# Patient Record
Sex: Female | Born: 1990 | Race: White | Hispanic: No | Marital: Single | Smoking: Current every day smoker
Health system: Southern US, Community
[De-identification: ages and names within clinical notes are randomized; demographics above are authoritative.]

## PROBLEM LIST (undated history)

## (undated) ENCOUNTER — Inpatient Hospital Stay (HOSPITAL_COMMUNITY): Payer: Self-pay

## (undated) DIAGNOSIS — R87619 Unspecified abnormal cytological findings in specimens from cervix uteri: Secondary | ICD-10-CM

## (undated) DIAGNOSIS — IMO0002 Reserved for concepts with insufficient information to code with codable children: Secondary | ICD-10-CM

## (undated) DIAGNOSIS — B009 Herpesviral infection, unspecified: Secondary | ICD-10-CM

## (undated) DIAGNOSIS — N390 Urinary tract infection, site not specified: Secondary | ICD-10-CM

## (undated) HISTORY — PX: NO PAST SURGERIES: SHX2092

---
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
## 2008-04-16 ENCOUNTER — Emergency Department (HOSPITAL_COMMUNITY): Admission: EM | Admit: 2008-04-16 | Discharge: 2008-04-16 | Payer: Self-pay | Admitting: Emergency Medicine

## 2008-05-02 ENCOUNTER — Emergency Department (HOSPITAL_COMMUNITY): Admission: EM | Admit: 2008-05-02 | Discharge: 2008-05-02 | Payer: Self-pay | Admitting: Emergency Medicine

## 2008-12-09 ENCOUNTER — Emergency Department (HOSPITAL_COMMUNITY): Admission: EM | Admit: 2008-12-09 | Discharge: 2008-12-09 | Payer: Self-pay | Admitting: Emergency Medicine

## 2009-01-13 ENCOUNTER — Emergency Department (HOSPITAL_COMMUNITY): Admission: EM | Admit: 2009-01-13 | Discharge: 2009-01-13 | Payer: Self-pay | Admitting: Emergency Medicine

## 2009-01-19 ENCOUNTER — Inpatient Hospital Stay (HOSPITAL_COMMUNITY): Admission: AD | Admit: 2009-01-19 | Discharge: 2009-01-19 | Payer: Self-pay | Admitting: Family Medicine

## 2009-01-29 ENCOUNTER — Ambulatory Visit (HOSPITAL_COMMUNITY): Admission: RE | Admit: 2009-01-29 | Discharge: 2009-01-29 | Payer: Self-pay | Admitting: Family Medicine

## 2009-04-09 ENCOUNTER — Inpatient Hospital Stay (HOSPITAL_COMMUNITY): Admission: AD | Admit: 2009-04-09 | Discharge: 2009-04-09 | Payer: Self-pay | Admitting: Obstetrics

## 2009-05-05 ENCOUNTER — Ambulatory Visit
Admission: RE | Admit: 2009-05-05 | Discharge: 2009-05-05 | Payer: Self-pay | Source: Home / Self Care | Admitting: Obstetrics

## 2009-05-28 ENCOUNTER — Inpatient Hospital Stay (HOSPITAL_COMMUNITY): Admission: AD | Admit: 2009-05-28 | Discharge: 2009-05-28 | Payer: Self-pay | Admitting: Obstetrics

## 2009-05-28 ENCOUNTER — Ambulatory Visit: Payer: Self-pay | Admitting: Advanced Practice Midwife

## 2009-08-18 ENCOUNTER — Inpatient Hospital Stay (HOSPITAL_COMMUNITY): Admission: AD | Admit: 2009-08-18 | Discharge: 2009-08-18 | Payer: Self-pay | Admitting: Obstetrics

## 2009-08-18 ENCOUNTER — Ambulatory Visit: Payer: Self-pay | Admitting: Advanced Practice Midwife

## 2009-09-03 ENCOUNTER — Ambulatory Visit: Payer: Self-pay | Admitting: Obstetrics and Gynecology

## 2009-09-03 ENCOUNTER — Inpatient Hospital Stay (HOSPITAL_COMMUNITY): Admission: AD | Admit: 2009-09-03 | Discharge: 2009-09-03 | Payer: Self-pay | Admitting: Obstetrics

## 2009-09-10 ENCOUNTER — Inpatient Hospital Stay (HOSPITAL_COMMUNITY): Admission: AD | Admit: 2009-09-10 | Discharge: 2009-09-10 | Payer: Self-pay | Admitting: Obstetrics

## 2009-09-20 ENCOUNTER — Inpatient Hospital Stay (HOSPITAL_COMMUNITY): Admission: AD | Admit: 2009-09-20 | Discharge: 2009-09-20 | Payer: Self-pay | Admitting: Obstetrics

## 2009-09-23 ENCOUNTER — Inpatient Hospital Stay (HOSPITAL_COMMUNITY): Admission: RE | Admit: 2009-09-23 | Discharge: 2009-09-25 | Payer: Self-pay | Admitting: Obstetrics

## 2010-01-06 ENCOUNTER — Emergency Department (HOSPITAL_COMMUNITY)
Admission: EM | Admit: 2010-01-06 | Discharge: 2010-01-06 | Payer: Self-pay | Source: Home / Self Care | Admitting: Emergency Medicine

## 2010-02-18 ENCOUNTER — Emergency Department (HOSPITAL_COMMUNITY)
Admission: EM | Admit: 2010-02-18 | Discharge: 2010-02-19 | Disposition: A | Discharge status: Home / Self Care | Payer: Medicaid Other | Attending: Emergency Medicine | Admitting: Emergency Medicine

## 2010-02-18 ENCOUNTER — Emergency Department (HOSPITAL_COMMUNITY)
Admission: EM | Admit: 2010-02-18 | Discharge: 2010-02-18 | Discharge status: Against Medical Advice | Payer: Self-pay | Attending: Emergency Medicine | Admitting: Emergency Medicine

## 2010-02-18 DIAGNOSIS — R21 Rash and other nonspecific skin eruption: Secondary | ICD-10-CM | POA: Insufficient documentation

## 2010-02-18 DIAGNOSIS — I10 Essential (primary) hypertension: Secondary | ICD-10-CM | POA: Insufficient documentation

## 2010-02-18 DIAGNOSIS — T7840XA Allergy, unspecified, initial encounter: Secondary | ICD-10-CM | POA: Insufficient documentation

## 2010-02-18 DIAGNOSIS — L298 Other pruritus: Secondary | ICD-10-CM | POA: Insufficient documentation

## 2010-02-18 DIAGNOSIS — R221 Localized swelling, mass and lump, neck: Secondary | ICD-10-CM | POA: Insufficient documentation

## 2010-02-18 DIAGNOSIS — R220 Localized swelling, mass and lump, head: Secondary | ICD-10-CM | POA: Insufficient documentation

## 2010-02-18 DIAGNOSIS — L2989 Other pruritus: Secondary | ICD-10-CM | POA: Insufficient documentation

## 2010-03-18 LAB — CBC
HCT: 27.4 % — ABNORMAL LOW (ref 36.0–46.0)
HCT: 32 % — ABNORMAL LOW (ref 36.0–46.0)
Hemoglobin: 10.6 g/dL — ABNORMAL LOW (ref 12.0–15.0)
MCH: 28.6 pg (ref 26.0–34.0)
MCV: 85.1 fL (ref 78.0–100.0)
MCV: 85.5 fL (ref 78.0–100.0)
Platelets: 196 10*3/uL (ref 150–400)
Platelets: 248 10*3/uL (ref 150–400)
RBC: 3.76 MIL/uL — ABNORMAL LOW (ref 3.87–5.11)
WBC: 10.9 10*3/uL — ABNORMAL HIGH (ref 4.0–10.5)

## 2010-03-18 LAB — WET PREP, GENITAL

## 2010-03-18 LAB — RPR: RPR Ser Ql: NONREACTIVE

## 2010-03-19 LAB — URINALYSIS, ROUTINE W REFLEX MICROSCOPIC
Hgb urine dipstick: NEGATIVE
Ketones, ur: NEGATIVE mg/dL

## 2010-03-21 LAB — URINALYSIS, ROUTINE W REFLEX MICROSCOPIC
Bilirubin Urine: NEGATIVE
Ketones, ur: 15 mg/dL — AB
Leukocytes, UA: NEGATIVE
Protein, ur: NEGATIVE mg/dL
Urobilinogen, UA: 1 mg/dL (ref 0.0–1.0)

## 2010-03-21 LAB — WET PREP, GENITAL: Trich, Wet Prep: NONE SEEN

## 2010-03-21 LAB — POCT PREGNANCY, URINE: Preg Test, Ur: POSITIVE

## 2010-03-21 LAB — URINE MICROSCOPIC-ADD ON

## 2010-03-21 LAB — GC/CHLAMYDIA PROBE AMP, GENITAL: Chlamydia, DNA Probe: NEGATIVE

## 2010-03-22 LAB — URINALYSIS, ROUTINE W REFLEX MICROSCOPIC
Glucose, UA: NEGATIVE mg/dL
Hgb urine dipstick: NEGATIVE
Ketones, ur: NEGATIVE mg/dL
Nitrite: NEGATIVE
Protein, ur: NEGATIVE mg/dL
Specific Gravity, Urine: 1.01 (ref 1.005–1.030)
Urobilinogen, UA: 0.2 mg/dL (ref 0.0–1.0)
pH: 7.5 (ref 5.0–8.0)

## 2010-03-22 LAB — URINE MICROSCOPIC-ADD ON

## 2010-03-24 LAB — URINALYSIS, ROUTINE W REFLEX MICROSCOPIC
Bilirubin Urine: NEGATIVE
Hgb urine dipstick: NEGATIVE
Nitrite: NEGATIVE
Protein, ur: NEGATIVE mg/dL
Urobilinogen, UA: 0.2 mg/dL (ref 0.0–1.0)
pH: 7 (ref 5.0–8.0)

## 2010-03-24 LAB — POCT PREGNANCY, URINE: Preg Test, Ur: POSITIVE

## 2010-03-24 LAB — WET PREP, GENITAL: Trich, Wet Prep: NONE SEEN

## 2010-03-24 LAB — GC/CHLAMYDIA PROBE AMP, GENITAL: GC Probe Amp, Genital: NEGATIVE

## 2010-04-14 LAB — DIFFERENTIAL
Basophils Absolute: 0.1 10*3/uL (ref 0.0–0.1)
Basophils Relative: 1 % (ref 0–1)
Eosinophils Relative: 1 % (ref 0–5)
Lymphocytes Relative: 15 % (ref 12–46)
Monocytes Absolute: 0.8 10*3/uL (ref 0.1–1.0)

## 2010-04-14 LAB — WET PREP, GENITAL
Clue Cells Wet Prep HPF POC: NONE SEEN
Trich, Wet Prep: NONE SEEN
Yeast Wet Prep HPF POC: NONE SEEN

## 2010-04-14 LAB — CBC
HCT: 42.4 % (ref 36.0–46.0)
Hemoglobin: 14.7 g/dL (ref 12.0–15.0)
MCHC: 34.6 g/dL (ref 30.0–36.0)
Platelets: 215 10*3/uL (ref 150–400)
RDW: 12.3 % (ref 11.5–15.5)

## 2010-04-14 LAB — URINALYSIS, ROUTINE W REFLEX MICROSCOPIC
Bilirubin Urine: NEGATIVE
Glucose, UA: NEGATIVE mg/dL
Hgb urine dipstick: NEGATIVE
Specific Gravity, Urine: 1.007 (ref 1.005–1.030)
pH: 7 (ref 5.0–8.0)

## 2010-04-14 LAB — POCT INFECTIOUS MONO SCREEN: Mono Screen: NEGATIVE

## 2010-04-14 LAB — STREP A DNA PROBE

## 2010-04-14 LAB — POCT RAPID STREP A (OFFICE): Streptococcus, Group A Screen (Direct): NEGATIVE

## 2010-04-14 LAB — GC/CHLAMYDIA PROBE AMP, GENITAL
Chlamydia, DNA Probe: NEGATIVE
GC Probe Amp, Genital: NEGATIVE

## 2010-04-16 ENCOUNTER — Emergency Department (HOSPITAL_COMMUNITY)
Admission: EM | Admit: 2010-04-16 | Discharge: 2010-04-16 | Discharge status: Against Medical Advice | Payer: Medicaid Other | Attending: Emergency Medicine | Admitting: Emergency Medicine

## 2010-04-16 DIAGNOSIS — N63 Unspecified lump in unspecified breast: Secondary | ICD-10-CM | POA: Insufficient documentation

## 2010-04-25 ENCOUNTER — Inpatient Hospital Stay (HOSPITAL_COMMUNITY)
Admission: AD | Admit: 2010-04-25 | Discharge: 2010-04-25 | Disposition: A | Discharge status: Home / Self Care | Payer: Medicaid Other | Source: Ambulatory Visit | Attending: Obstetrics & Gynecology | Admitting: Obstetrics & Gynecology

## 2010-04-25 DIAGNOSIS — N644 Mastodynia: Secondary | ICD-10-CM

## 2010-04-26 ENCOUNTER — Other Ambulatory Visit: Payer: Self-pay | Admitting: Obstetrics & Gynecology

## 2010-04-26 DIAGNOSIS — N63 Unspecified lump in unspecified breast: Secondary | ICD-10-CM

## 2010-04-27 ENCOUNTER — Inpatient Hospital Stay: Admission: RE | Admit: 2010-04-27 | Payer: Medicaid Other | Source: Ambulatory Visit

## 2010-09-26 ENCOUNTER — Emergency Department (HOSPITAL_COMMUNITY)
Admission: EM | Admit: 2010-09-26 | Discharge: 2010-09-26 | Disposition: A | Discharge status: Home / Self Care | Payer: Medicaid Other | Attending: Emergency Medicine | Admitting: Emergency Medicine

## 2010-09-26 DIAGNOSIS — L03317 Cellulitis of buttock: Secondary | ICD-10-CM | POA: Insufficient documentation

## 2010-09-26 DIAGNOSIS — L0231 Cutaneous abscess of buttock: Secondary | ICD-10-CM | POA: Insufficient documentation

## 2010-09-28 ENCOUNTER — Inpatient Hospital Stay (INDEPENDENT_AMBULATORY_CARE_PROVIDER_SITE_OTHER)
Admission: RE | Admit: 2010-09-28 | Discharge: 2010-09-28 | Disposition: A | Discharge status: Home / Self Care | Payer: Medicaid Other | Source: Ambulatory Visit | Attending: Family Medicine | Admitting: Family Medicine

## 2010-09-28 DIAGNOSIS — L0231 Cutaneous abscess of buttock: Secondary | ICD-10-CM

## 2011-01-04 NOTE — L&D Delivery Note (Signed)
Delivery Note At 9:26 PM a viable female was delivered via Vaginal, Spontaneous Delivery (Presentation: ; Occiput Anterior).  APGAR: , ; weight .   Placenta status: , .  Cord: 3 vessels with the following complications: None.  Cord pH: not done  Anesthesia: Epidural  Episiotomy: None Lacerations: None Suture Repair: 2.0 Est. Blood Loss (mL):   Mom to postpartum.  Baby to nursery-stable.  Meagan Burgess A 11/15/2011, 9:39 PM

## 2011-03-29 ENCOUNTER — Inpatient Hospital Stay (HOSPITAL_COMMUNITY)
Admission: AD | Admit: 2011-03-29 | Discharge: 2011-03-29 | Disposition: A | Discharge status: Home / Self Care | Payer: Self-pay | Source: Ambulatory Visit | Attending: Obstetrics & Gynecology | Admitting: Obstetrics & Gynecology

## 2011-03-29 ENCOUNTER — Encounter (HOSPITAL_COMMUNITY): Payer: Self-pay | Admitting: *Deleted

## 2011-03-29 ENCOUNTER — Inpatient Hospital Stay (HOSPITAL_COMMUNITY): Payer: Self-pay

## 2011-03-29 DIAGNOSIS — O99891 Other specified diseases and conditions complicating pregnancy: Secondary | ICD-10-CM | POA: Insufficient documentation

## 2011-03-29 DIAGNOSIS — R109 Unspecified abdominal pain: Secondary | ICD-10-CM | POA: Insufficient documentation

## 2011-03-29 DIAGNOSIS — O26899 Other specified pregnancy related conditions, unspecified trimester: Secondary | ICD-10-CM

## 2011-03-29 LAB — URINALYSIS, ROUTINE W REFLEX MICROSCOPIC
Bilirubin Urine: NEGATIVE
Glucose, UA: NEGATIVE mg/dL
Hgb urine dipstick: NEGATIVE
Protein, ur: NEGATIVE mg/dL

## 2011-03-29 LAB — WET PREP, GENITAL
Trich, Wet Prep: NONE SEEN
Yeast Wet Prep HPF POC: NONE SEEN

## 2011-03-29 LAB — POCT PREGNANCY, URINE: Preg Test, Ur: POSITIVE — AB

## 2011-03-29 LAB — CBC
HCT: 38.9 % (ref 36.0–46.0)
Hemoglobin: 13.1 g/dL (ref 12.0–15.0)
MCHC: 33.7 g/dL (ref 30.0–36.0)
MCV: 89.6 fL (ref 78.0–100.0)

## 2011-03-29 NOTE — MAU Provider Note (Signed)
History     CSN: [id number]  Arrival date and time: 03/29/11 1548   First Provider Initiated Contact with Patient 03/29/11 1714      Chief Complaint  Patient presents with  . Possible Pregnancy   HPI Pt is early pregnant with LMP 02/15/2011 and lower abdominal pain that is 2/10 constant and increases with certain moves- the pain is bilateral.  She denies spotting or bleeding or vaginal discharge.  She denies pain with urination or constiaption or diarrhea.    Past Medical History  Diagnosis Date  . No pertinent past medical history     Past Surgical History  Procedure Date  . No past surgeries     Family History  Problem Relation Age of Onset  . Anesthesia problems Neg Hx     History  Substance Use Topics  . Smoking status: Current Everyday Smoker    Types: Cigarettes  . Smokeless tobacco: Not on file  . Alcohol Use:     Allergies: No Known Allergies  Prescriptions prior to admission  Medication Sig Dispense Refill  . acetaminophen (TYLENOL) 500 MG tablet Take 1,000 mg by mouth every 6 (six) hours as needed.        Review of Systems  Constitutional: Negative for fever and chills.  Gastrointestinal: Positive for nausea and abdominal pain. Negative for vomiting, diarrhea and constipation.  Genitourinary: Positive for frequency. Negative for dysuria and urgency.  Neurological: Negative for dizziness.   Physical Exam   Blood pressure 107/66, pulse 83, temperature 98.4 F (36.9 C), temperature source Oral, resp. rate 16, height 5\' 1"  (1.549 m), weight 112 lb 2 oz (50.86 kg), last menstrual period 02/15/2011, SpO2 100.00%.  Physical Exam  Vitals reviewed. Constitutional: She is oriented to person, place, and time. She appears well-developed and well-nourished.  HENT:  Head: Normocephalic.  Eyes: Pupils are equal, round, and reactive to light.  Neck: Normal range of motion. Neck supple.  Cardiovascular: Normal rate.   Respiratory: Effort normal.  GI: Soft.  She exhibits no distension. There is tenderness. There is guarding. There is no rebound.  Genitourinary:       Small amount of creamy white discharge in vault; cervix clean, NT; Uterus NSSC left adnexal tenderness noted without rebound- right adnexa slightly tender  Musculoskeletal: Normal range of motion.  Neurological: She is alert and oriented to person, place, and time.  Skin: Skin is warm and dry.  Psychiatric: She has a normal mood and affect.    MAU Course  Procedures  Results for orders placed during the hospital encounter of 03/29/11 (from the past 24 hour(s))  PREGNANCY, URINE     Status: Abnormal   Collection Time   03/29/11  4:11 PM      Component Value Range   Preg Test, Ur POSITIVE (*) NEGATIVE   URINALYSIS, ROUTINE W REFLEX MICROSCOPIC     Status: Normal   Collection Time   03/29/11  4:11 PM      Component Value Range   Color, Urine YELLOW  YELLOW    APPearance CLEAR  CLEAR    Specific Gravity, Urine 1.010  1.005 - 1.030    pH 7.0  5.0 - 8.0    Glucose, UA NEGATIVE  NEGATIVE (mg/dL)   Hgb urine dipstick NEGATIVE  NEGATIVE    Bilirubin Urine NEGATIVE  NEGATIVE    Ketones, ur NEGATIVE  NEGATIVE (mg/dL)   Protein, ur NEGATIVE  NEGATIVE (mg/dL)   Urobilinogen, UA 0.2  0.0 - 1.0 (mg/dL)  Nitrite NEGATIVE  NEGATIVE    Leukocytes, UA NEGATIVE  NEGATIVE   POCT PREGNANCY, URINE     Status: Abnormal   Collection Time   03/29/11  4:58 PM      Component Value Range   Preg Test, Ur POSITIVE (*) NEGATIVE   WET PREP, GENITAL     Status: Abnormal   Collection Time   03/29/11  5:27 PM      Component Value Range   Yeast Wet Prep HPF POC NONE SEEN  NONE SEEN    Trich, Wet Prep NONE SEEN  NONE SEEN    Clue Cells Wet Prep HPF POC NONE SEEN  NONE SEEN    WBC, Wet Prep HPF POC MODERATE (*) NONE SEEN   CBC     Status: Abnormal   Collection Time   03/29/11  5:30 PM      Component Value Range   WBC 10.8 (*) 4.0 - 10.5 (K/uL)   RBC 4.34  3.87 - 5.11 (MIL/uL)   Hemoglobin 13.1   12.0 - 15.0 (g/dL)   HCT 13.2  44.0 - 10.2 (%)   MCV 89.6  78.0 - 100.0 (fL)   MCH 30.2  26.0 - 34.0 (pg)   MCHC 33.7  30.0 - 36.0 (g/dL)   RDW 72.5  36.6 - 44.0 (%)   Platelets 208  150 - 400 (K/uL)  HCG, QUANTITATIVE, PREGNANCY     Status: Abnormal   Collection Time   03/29/11  5:30 PM      Component Value Range   hCG, Beta Chain, Quant, S 333 (*) <5 (mIU/mL)   Assessment and Plan  Abdominal pain in early pregnancy F/u repeat BetaHCG Friday am 04/01/2011 Ectopic precautions given  Meagan Burgess 03/29/2011, 5:15 PM

## 2011-03-29 NOTE — MAU Note (Signed)
Pt in c/o sharp bilateral lower abdominal pains, worse with movement and walking.  Minimal pain right now.  LMP 02/15/11.  Denies any bleeding or discharge.  + UPT 4 days ago.

## 2011-03-29 NOTE — Discharge Instructions (Signed)
Abdominal Pain During Pregnancy Abdominal discomfort is common in pregnancy. Most of the time, it does not cause harm. There are many causes of abdominal pain. Some causes are more serious than others. Some of the causes of abdominal pain in pregnancy are easily diagnosed. Occasionally, the diagnosis takes time to understand. Other times, the cause is not determined. Abdominal pain can be a sign that something is very wrong with the pregnancy, or the pain may have nothing to do with the pregnancy at all. For this reason, always tell your caregiver if you have any abdominal discomfort. CAUSES Common and harmless causes of abdominal pain include:  Constipation.   Excess gas and bloating.   Round ligament pain. This is pain that is felt in the folds of the groin.   The position the baby or placenta is in.   Baby kicks.   Braxton-Hicks contractions. These are mild contractions that do not cause cervical dilation.  Serious causes of abdominal pain include:  Ectopic pregnancy. This happens when a fertilized egg implants outside of the uterus.   Miscarriage.   Preterm labor. This is when labor starts at less than 37 weeks of pregnancy.   Placental abruption. This is when the placenta partially or completely separates from the uterus.   Preeclampsia. This is often associated with high blood pressure and has been referred to as "toxemia in pregnancy."   Uterine or amniotic fluid infections.  Causes unrelated to pregnancy include:  Urinary tract infection.   Gallbladder stones or inflammation.   Hepatitis or other liver illness.   Intestinal problems, stomach flu, food poisoning, or ulcer.   Appendicitis.   Kidney (renal) stones.   Kidney infection (pylonephritis).  HOME CARE INSTRUCTIONS  For mild pain:  Do not have sexual intercourse or put anything in your vagina until your symptoms go away completely.   Get plenty of rest until your pain improves. If your pain does not  improve in 1 hour, call your caregiver.   Drink clear fluids if you feel nauseous. Avoid solid food as long as you are uncomfortable or nauseous.   Only take medicine as directed by your caregiver.   Keep all follow-up appointments with your caregiver.  SEEK IMMEDIATE MEDICAL CARE IF:  You are bleeding, leaking fluid, or passing tissue from the vagina.   You have increasing pain or cramping.   You have persistent vomiting.   You have painful or bloody urination.   You have a fever.   You notice a decrease in your baby's movements.   You have extreme weakness or feel faint.   You have shortness of breath, with or without abdominal pain.   You develop a severe headache with abdominal pain.   You have abnormal vaginal discharge with abdominal pain.   You have persistent diarrhea.   You have abdominal pain that continues even after rest, or gets worse.  MAKE SURE YOU:   Understand these instructions.   Will watch your condition.   Will get help right away if you are not doing well or get worse.  Document Released: 12/20/2004 Document Revised: 12/09/2010 Document Reviewed: 07/16/2010 Exit[employer] Patient Information 2012 [employer].Abdominal Pain During Pregnancy Abdominal discomfort is common in pregnancy. Most of the time, it does not cause harm. There are many causes of abdominal pain. Some causes are more serious than others. Some of the causes of abdominal pain in pregnancy are easily diagnosed. Occasionally, the diagnosis takes time to understand. Other times, the cause is not determined.   Abdominal pain can be a sign that something is very wrong with the pregnancy, or the pain may have nothing to do with the pregnancy at all. For this reason, always tell your caregiver if you have any abdominal discomfort. CAUSES Common and harmless causes of abdominal pain include:  Constipation.   Excess gas and bloating.   Round ligament pain. This is pain that is felt in the  folds of the groin.   The position the baby or placenta is in.   Baby kicks.   Braxton-Hicks contractions. These are mild contractions that do not cause cervical dilation.  Serious causes of abdominal pain include:  Ectopic pregnancy. This happens when a fertilized egg implants outside of the uterus.   Miscarriage.   Preterm labor. This is when labor starts at less than 37 weeks of pregnancy.   Placental abruption. This is when the placenta partially or completely separates from the uterus.   Preeclampsia. This is often associated with high blood pressure and has been referred to as "toxemia in pregnancy."   Uterine or amniotic fluid infections.  Causes unrelated to pregnancy include:  Urinary tract infection.   Gallbladder stones or inflammation.   Hepatitis or other liver illness.   Intestinal problems, stomach flu, food poisoning, or ulcer.   Appendicitis.   Kidney (renal) stones.   Kidney infection (pylonephritis).  HOME CARE INSTRUCTIONS  For mild pain:  Do not have sexual intercourse or put anything in your vagina until your symptoms go away completely.   Get plenty of rest until your pain improves. If your pain does not improve in 1 hour, call your caregiver.   Drink clear fluids if you feel nauseous. Avoid solid food as long as you are uncomfortable or nauseous.   Only take medicine as directed by your caregiver.   Keep all follow-up appointments with your caregiver.  SEEK IMMEDIATE MEDICAL CARE IF:  You are bleeding, leaking fluid, or passing tissue from the vagina.   You have increasing pain or cramping.   You have persistent vomiting.   You have painful or bloody urination.   You have a fever.   You notice a decrease in your baby's movements.   You have extreme weakness or feel faint.   You have shortness of breath, with or without abdominal pain.   You develop a severe headache with abdominal pain.   You have abnormal vaginal discharge  with abdominal pain.   You have persistent diarrhea.   You have abdominal pain that continues even after rest, or gets worse.  MAKE SURE YOU:   Understand these instructions.   Will watch your condition.   Will get help right away if you are not doing well or get worse.  Document Released: 12/20/2004 Document Revised: 12/09/2010 Document Reviewed: 07/16/2010 [employer] Patient Information 2012 [employer]. 

## 2011-03-29 NOTE — MAU Note (Signed)
Pt states +upt 4 days ago at home, LMP-02/15/2011, denies bleeding or vaginal d/c changes. Notes bilateral lower abd pain x4 days. Unsure if it is from picking up 43 month old child or pregnancy related.

## 2011-03-30 LAB — GC/CHLAMYDIA PROBE AMP, GENITAL
Chlamydia, DNA Probe: NEGATIVE
GC Probe Amp, Genital: NEGATIVE

## 2011-03-30 NOTE — MAU Provider Note (Signed)
Agree with above note.  Lonna Rabold 03/30/2011 7:48 AM   

## 2011-04-01 ENCOUNTER — Inpatient Hospital Stay (HOSPITAL_COMMUNITY)
Admission: AD | Admit: 2011-04-01 | Discharge: 2011-04-01 | Disposition: A | Discharge status: Home / Self Care | Payer: Medicaid Other | Source: Ambulatory Visit | Attending: Obstetrics & Gynecology | Admitting: Obstetrics & Gynecology

## 2011-04-01 DIAGNOSIS — O99891 Other specified diseases and conditions complicating pregnancy: Secondary | ICD-10-CM | POA: Insufficient documentation

## 2011-04-01 DIAGNOSIS — O26899 Other specified pregnancy related conditions, unspecified trimester: Secondary | ICD-10-CM

## 2011-04-01 DIAGNOSIS — R109 Unspecified abdominal pain: Secondary | ICD-10-CM | POA: Insufficient documentation

## 2011-04-01 NOTE — MAU Note (Signed)
Patient reports pain in left lower abdomen consistent with two days ago, denies bleeding.

## 2011-04-01 NOTE — MAU Provider Note (Signed)
  History     CSN: [id number]  Arrival date and time: 04/01/11 1218   None     Chief Complaint  Patient presents with  . Abdominal Pain   HPI  Pt here for follow-up BHCG; first seen in MAU on 03/29/11.  BHCG 333.  Ultrasound as below.  Pt here today with same level of pelvic pain, no vaginal bleeding.  Wet prep - neg; gc/ct neg x 2.     Past Medical History  Diagnosis Date  . No pertinent past medical history     Past Surgical History  Procedure Date  . No past surgeries     Family History  Problem Relation Age of Onset  . Anesthesia problems Neg Hx     History  Substance Use Topics  . Smoking status: Current Everyday Smoker    Types: Cigarettes  . Smokeless tobacco: Not on file  . Alcohol Use:     Allergies: No Known Allergies  Prescriptions prior to admission  Medication Sig Dispense Refill  . acetaminophen (TYLENOL) 500 MG tablet Take 1,000 mg by mouth every 6 (six) hours as needed.        ROS Physical Exam   Blood pressure 118/68, pulse 93, temperature 97.6 F (36.4 C), temperature source Oral, resp. rate 16, height 5\' 1"  (1.549 m), weight 50.259 kg (110 lb 12.8 oz), last menstrual period 02/15/2011.  Physical Exam  Constitutional: She is oriented to person, place, and time. She appears well-developed and well-nourished. No distress.  HENT:  Head: Normocephalic.  Neck: Normal range of motion. Neck supple.  Neurological: She is alert and oriented to person, place, and time. She has normal reflexes.  Skin: Skin is warm and dry.    MAU Course  Procedures Results for orders placed during the hospital encounter of 04/01/11 (from the past 24 hour(s))  HCG, QUANTITATIVE, PREGNANCY     Status: Abnormal   Collection Time   04/01/11 12:30 PM      Component Value Range   hCG, Beta Chain, Quant, S 1496 (*) <5 (mIU/mL)     Assessment and Plan  Pregnancy  Plan: Obtain ultrasound on 04/05/11 for IUP Ectopic precautions  given.  [facility] 04/01/2011, 1:40 PM

## 2011-04-05 ENCOUNTER — Inpatient Hospital Stay (HOSPITAL_COMMUNITY)
Admission: AD | Admit: 2011-04-05 | Discharge: 2011-04-05 | Disposition: A | Discharge status: Home / Self Care | Payer: Self-pay | Source: Ambulatory Visit | Attending: Family Medicine | Admitting: Family Medicine

## 2011-04-05 ENCOUNTER — Ambulatory Visit (HOSPITAL_COMMUNITY)
Admission: RE | Admit: 2011-04-05 | Discharge: 2011-04-05 | Disposition: A | Discharge status: Home / Self Care | Payer: Self-pay | Source: Ambulatory Visit | Attending: Family | Admitting: Family

## 2011-04-05 ENCOUNTER — Encounter (HOSPITAL_COMMUNITY): Payer: Self-pay | Admitting: *Deleted

## 2011-04-05 DIAGNOSIS — O99891 Other specified diseases and conditions complicating pregnancy: Secondary | ICD-10-CM | POA: Insufficient documentation

## 2011-04-05 DIAGNOSIS — Z3A01 Less than 8 weeks gestation of pregnancy: Secondary | ICD-10-CM

## 2011-04-05 DIAGNOSIS — R109 Unspecified abdominal pain: Secondary | ICD-10-CM

## 2011-04-05 HISTORY — DX: Urinary tract infection, site not specified: N39.0

## 2011-04-05 HISTORY — DX: Unspecified abnormal cytological findings in specimens from cervix uteri: R87.619

## 2011-04-05 HISTORY — DX: Reserved for concepts with insufficient information to code with codable children: IMO0002

## 2011-04-05 LAB — HCG, QUANTITATIVE, PREGNANCY: hCG, Beta Chain, Quant, S: 9483 m[IU]/mL — ABNORMAL HIGH (ref <5)

## 2011-04-05 NOTE — MAU Note (Signed)
Feeling better, occ pain, but nothing like it was.  Denies bleeding.

## 2011-04-05 NOTE — MAU Provider Note (Signed)
History   Chief Complaint:  Follow-up   Meagan Burgess is  21 y.o. G2P1001 Patient's last menstrual period was 02/15/2011.Marland Kitchen Patient is here for follow up of quantitative HCG and ongoing surveillance of pregnancy status.   She is [redacted]w[redacted]d weeks gestation  by LMP.    Since her last visit, the patient is without new complaint.   The patient reports bleeding as  none now.    General ROS:  negative  Her previous Quantitative HCG values are: 03/29/11: 333 04/01/11: 1496 04/05/11:    Physical Exam   Blood pressure 113/60, pulse 71, temperature 98.1 F (36.7 C), temperature source Oral, resp. rate 20, last menstrual period 02/15/2011.  Focused Gynecological Exam: examination not indicated  Labs: No results found for this or any previous visit (from the past 24 hour(s)).  Ultrasound Studies:   US shows 5.2 weeks GS, no FP or YS. Small SCH.  Assessment: Early pregnancy, S<D, but uncertain LMP  Plan: F/U US in 1 week SAB and ectopic precautions Quant pending  Meagan Burgess 04/05/2011, 1:28 PM

## 2011-04-05 NOTE — MAU Provider Note (Signed)
Chart reviewed and agree with management and plan.  

## 2011-04-05 NOTE — Discharge Instructions (Signed)
Follow-up Ultrasound 04/12/11 at 10:00.

## 2011-04-12 ENCOUNTER — Ambulatory Visit (HOSPITAL_COMMUNITY): Admit: 2011-04-12 | Payer: Self-pay

## 2011-04-12 ENCOUNTER — Ambulatory Visit (HOSPITAL_COMMUNITY)
Admission: RE | Admit: 2011-04-12 | Discharge: 2011-04-12 | Disposition: A | Discharge status: Home / Self Care | Payer: Self-pay | Source: Ambulatory Visit | Attending: Advanced Practice Midwife | Admitting: Advanced Practice Midwife

## 2011-04-12 DIAGNOSIS — Z3A01 Less than 8 weeks gestation of pregnancy: Secondary | ICD-10-CM

## 2011-04-12 DIAGNOSIS — O3680X0 Pregnancy with inconclusive fetal viability, not applicable or unspecified: Secondary | ICD-10-CM | POA: Insufficient documentation

## 2011-04-12 DIAGNOSIS — Z3689 Encounter for other specified antenatal screening: Secondary | ICD-10-CM | POA: Insufficient documentation

## 2011-05-17 ENCOUNTER — Encounter (HOSPITAL_COMMUNITY): Payer: Self-pay | Admitting: Advanced Practice Midwife

## 2011-07-04 ENCOUNTER — Other Ambulatory Visit (HOSPITAL_COMMUNITY): Payer: Self-pay | Admitting: Obstetrics

## 2011-07-04 DIAGNOSIS — Z0489 Encounter for examination and observation for other specified reasons: Secondary | ICD-10-CM

## 2011-07-08 ENCOUNTER — Ambulatory Visit (HOSPITAL_COMMUNITY)
Admission: RE | Admit: 2011-07-08 | Discharge: 2011-07-08 | Disposition: A | Discharge status: Home / Self Care | Payer: Medicaid Other | Source: Ambulatory Visit | Attending: Obstetrics | Admitting: Obstetrics

## 2011-07-08 DIAGNOSIS — Z0489 Encounter for examination and observation for other specified reasons: Secondary | ICD-10-CM

## 2011-07-08 DIAGNOSIS — Z1389 Encounter for screening for other disorder: Secondary | ICD-10-CM | POA: Insufficient documentation

## 2011-07-08 DIAGNOSIS — Z363 Encounter for antenatal screening for malformations: Secondary | ICD-10-CM | POA: Insufficient documentation

## 2011-07-08 DIAGNOSIS — O358XX0 Maternal care for other (suspected) fetal abnormality and damage, not applicable or unspecified: Secondary | ICD-10-CM | POA: Insufficient documentation

## 2011-09-26 ENCOUNTER — Inpatient Hospital Stay (HOSPITAL_COMMUNITY)
Admission: AD | Admit: 2011-09-26 | Discharge: 2011-09-26 | Disposition: A | Discharge status: Home / Self Care | Payer: Medicaid Other | Source: Ambulatory Visit | Attending: Obstetrics | Admitting: Obstetrics

## 2011-09-26 ENCOUNTER — Encounter (HOSPITAL_COMMUNITY): Payer: Self-pay | Admitting: *Deleted

## 2011-09-26 DIAGNOSIS — L293 Anogenital pruritus, unspecified: Secondary | ICD-10-CM | POA: Insufficient documentation

## 2011-09-26 DIAGNOSIS — R42 Dizziness and giddiness: Secondary | ICD-10-CM

## 2011-09-26 DIAGNOSIS — O0930 Supervision of pregnancy with insufficient antenatal care, unspecified trimester: Secondary | ICD-10-CM | POA: Insufficient documentation

## 2011-09-26 DIAGNOSIS — N898 Other specified noninflammatory disorders of vagina: Secondary | ICD-10-CM

## 2011-09-26 DIAGNOSIS — O2390 Unspecified genitourinary tract infection in pregnancy, unspecified trimester: Secondary | ICD-10-CM | POA: Insufficient documentation

## 2011-09-26 DIAGNOSIS — N949 Unspecified condition associated with female genital organs and menstrual cycle: Secondary | ICD-10-CM | POA: Insufficient documentation

## 2011-09-26 DIAGNOSIS — N760 Acute vaginitis: Secondary | ICD-10-CM | POA: Insufficient documentation

## 2011-09-26 DIAGNOSIS — O479 False labor, unspecified: Secondary | ICD-10-CM

## 2011-09-26 DIAGNOSIS — B9689 Other specified bacterial agents as the cause of diseases classified elsewhere: Secondary | ICD-10-CM | POA: Insufficient documentation

## 2011-09-26 DIAGNOSIS — A499 Bacterial infection, unspecified: Secondary | ICD-10-CM | POA: Insufficient documentation

## 2011-09-26 LAB — URINALYSIS, ROUTINE W REFLEX MICROSCOPIC
Bilirubin Urine: NEGATIVE
Hgb urine dipstick: NEGATIVE
Nitrite: NEGATIVE
Specific Gravity, Urine: 1.015 (ref 1.005–1.030)
Urobilinogen, UA: 0.2 mg/dL (ref 0.0–1.0)
pH: 7 (ref 5.0–8.0)

## 2011-09-26 LAB — CBC
HCT: 28.4 % — ABNORMAL LOW (ref 36.0–46.0)
Hemoglobin: 9.6 g/dL — ABNORMAL LOW (ref 12.0–15.0)
MCH: 31 pg (ref 26.0–34.0)
MCHC: 33.8 g/dL (ref 30.0–36.0)
RDW: 12.4 % (ref 11.5–15.5)

## 2011-09-26 LAB — WET PREP, GENITAL
Trich, Wet Prep: NONE SEEN
Yeast Wet Prep HPF POC: NONE SEEN

## 2011-09-26 MED ORDER — PRENATAL PLUS 27-1 MG PO TABS: 1.0000 | ORAL_TABLET | Freq: Every day | ORAL | Status: DC

## 2011-09-26 MED ORDER — METRONIDAZOLE 500 MG PO TABS: 500.0000 mg | ORAL_TABLET | Freq: Two times a day (BID) | ORAL | Status: AC

## 2011-09-26 NOTE — MAU Note (Signed)
Just started feeling dizzy since got here.  Vaginal irritation and swollen "down there".  Thin discharge, called in last night was told to come in to check fluid level.

## 2011-09-26 NOTE — MAU Provider Note (Signed)
Chief Complaint:  Dizziness, ? leaking  and vaginal irritation    First Provider Initiated Contact with Patient 09/26/11 1117      HPI: Meagan Burgess is a 21 y.o. G2P1001 at 81w0dwho presents to maternity admissions reporting large amount of irritating, itchy vaginal discharge with malodor x weeks. Also feeling dizzy at times and baselilne hgb was 10. Last PNV with Dr. Gaynell Face July due to childcare and transportation but has made an appointment. . Denies contractions, leakage of fluid or vaginal bleeding. Good fetal movement.   Pregnancy Course: DNKAs.   Past Medical History: Past Medical History  Diagnosis Date  . Urinary tract infection   . Abnormal Pap smear     colpo- wnl    Past obstetric history: OB History    Grav Para Term Preterm Abortions TAB SAB Ect Mult Living   2 1 1  0 0 0 0 0 0 1     # Outc Date GA Lbr Len/2nd Wgt Sex Del Anes PTL Lv   1 TRM            2 CUR               Past Surgical History: Past Surgical History  Procedure Date  . No past surgeries     Family History: Family History  Problem Relation Age of Onset  . Anesthesia problems Neg Hx   . Hypertension Father   . Hypertension Maternal Grandmother   . Cancer Maternal Grandfather   . Diabetes Paternal Grandmother     Social History: History  Substance Use Topics  . Smoking status: Former Smoker -- 0.2 packs/day for 6 years    Types: Cigarettes    Quit date: 04/26/2011  . Smokeless tobacco: Never Used  . Alcohol Use: No    Allergies: No Known Allergies  Meds:  Prescriptions prior to admission  Medication Sig Dispense Refill  . acetaminophen (TYLENOL) 500 MG tablet Take 1,000 mg by mouth every 6 (six) hours as needed.        ROS: Pertinent findings in history of present illness.  Physical Exam  Blood pressure 127/65, pulse 95, temperature 98.3 F (36.8 C), temperature source Oral, resp. rate 18, height 5' (1.524 m), weight 57.153 kg (126 lb), last menstrual period  02/15/2011, SpO2 100.00%. GENERAL: Well-developed, well-nourished female in no acute distress.  HEENT: normocephalic HEART: normal rate RESP: normal effort ABDOMEN: Soft, non-tender, gravid appropriate for gestational age EXTREMITIES: Nontender, no edema NEURO: alert and oriented SPECULUM EXAM: NEFG, copious yellow white discharge, no blood, cervix clean Dilation: Closed Effacement (%): Thick Exam by:: D. Poe, CNM  FHT:  Baseline 130 , moderate variability, accelerations present, no decelerations Contractions: none   Labs: Results for orders placed during the hospital encounter of 09/26/11 (from the past 24 hour(s))  URINALYSIS, ROUTINE W REFLEX MICROSCOPIC     Status: Abnormal   Collection Time   09/26/11 10:47 AM      Component Value Range   Color, Urine YELLOW  YELLOW   APPearance CLEAR  CLEAR   Specific Gravity, Urine 1.015  1.005 - 1.030   pH 7.0  5.0 - 8.0   Glucose, UA NEGATIVE  NEGATIVE mg/dL   Hgb urine dipstick NEGATIVE  NEGATIVE   Bilirubin Urine NEGATIVE  NEGATIVE   Ketones, ur NEGATIVE  NEGATIVE mg/dL   Protein, ur NEGATIVE  NEGATIVE mg/dL   Urobilinogen, UA 0.2  0.0 - 1.0 mg/dL   Nitrite NEGATIVE  NEGATIVE   Leukocytes, UA MODERATE (*)  NEGATIVE  URINE MICROSCOPIC-ADD ON     Status: Abnormal   Collection Time   09/26/11 10:47 AM      Component Value Range   Squamous Epithelial / LPF FEW (*) RARE   WBC, UA 11-20  <3 WBC/hpf   RBC / HPF 0-2  <3 RBC/hpf   Bacteria, UA RARE  RARE  WET PREP, GENITAL     Status: Abnormal   Collection Time   09/26/11 11:20 AM      Component Value Range   Yeast Wet Prep HPF POC NONE SEEN  NONE SEEN   Trich, Wet Prep NONE SEEN  NONE SEEN   Clue Cells Wet Prep HPF POC MANY (*) NONE SEEN   WBC, Wet Prep HPF POC MANY (*) NONE SEEN  CBC     Status: Abnormal   Collection Time   09/26/11 11:32 AM      Component Value Range   WBC 8.1  4.0 - 10.5 K/uL   RBC 3.10 (*) 3.87 - 5.11 MIL/uL   Hemoglobin 9.6 (*) 12.0 - 15.0 g/dL   HCT 16.1  (*) 09.6 - 46.0 %   MCV 91.6  78.0 - 100.0 fL   MCH 31.0  26.0 - 34.0 pg   MCHC 33.8  30.0 - 36.0 g/dL   RDW 04.5  40.9 - 81.1 %   Platelets 197  150 - 400 K/uL    Assessment: 1. Insufficient prenatal care   2. BV (bacterial vaginosis)   G2P1001 at [redacted]w[redacted]d  Plan: Discharge home  Call Dr. Gaynell Face for prenatal appointment as soon as possible Safe sex precautions and fetal kick counts    Medication List     As of 09/26/2011 12:06 PM    TAKE these medications         acetaminophen 500 MG tablet   Commonly known as: TYLENOL   Take 500 mg by mouth every 6 (six) hours as needed. For pain/headache      calcium carbonate 500 MG chewable tablet   Commonly known as: TUMS - dosed in mg elemental calcium   Chew 1-2 tablets by mouth 4 (four) times daily as needed. For heartburn      metroNIDAZOLE 500 MG tablet   Commonly known as: FLAGYL   Take 1 tablet (500 mg total) by mouth 2 (two) times daily.      prenatal vitamin w/FE, FA 27-1 MG Tabs   Take 1 tablet by mouth daily.          Danae Orleans, CNM 09/26/2011 11:25 AM

## 2011-09-27 LAB — GC/CHLAMYDIA PROBE AMP, GENITAL: GC Probe Amp, Genital: NEGATIVE

## 2011-10-27 LAB — OB RESULTS CONSOLE GBS: GBS: NEGATIVE

## 2011-11-01 LAB — OB RESULTS CONSOLE RUBELLA ANTIBODY, IGM: Rubella: IMMUNE

## 2011-11-01 LAB — OB RESULTS CONSOLE RPR: RPR: NONREACTIVE

## 2011-11-01 LAB — OB RESULTS CONSOLE ANTIBODY SCREEN: Antibody Screen: NEGATIVE

## 2011-11-01 LAB — OB RESULTS CONSOLE HEPATITIS B SURFACE ANTIGEN: Hepatitis B Surface Ag: NEGATIVE

## 2011-11-07 ENCOUNTER — Encounter (HOSPITAL_COMMUNITY): Payer: Self-pay | Admitting: *Deleted

## 2011-11-07 ENCOUNTER — Telehealth (HOSPITAL_COMMUNITY): Payer: Self-pay | Admitting: *Deleted

## 2011-11-07 NOTE — Telephone Encounter (Signed)
Preadmission screen  

## 2011-11-15 ENCOUNTER — Inpatient Hospital Stay (HOSPITAL_COMMUNITY)
Admission: RE | Admit: 2011-11-15 | Discharge: 2011-11-17 | DRG: 775 | Disposition: A | Discharge status: Home / Self Care | Payer: Medicaid Other | Source: Ambulatory Visit | Attending: Obstetrics | Admitting: Obstetrics

## 2011-11-15 ENCOUNTER — Encounter (HOSPITAL_COMMUNITY): Payer: Self-pay

## 2011-11-15 ENCOUNTER — Inpatient Hospital Stay (HOSPITAL_COMMUNITY): Payer: Medicaid Other | Admitting: Anesthesiology

## 2011-11-15 ENCOUNTER — Encounter (HOSPITAL_COMMUNITY): Payer: Self-pay | Admitting: Anesthesiology

## 2011-11-15 LAB — CBC
MCH: 28.8 pg (ref 26.0–34.0)
Platelets: 221 10*3/uL (ref 150–400)
RBC: 3.71 MIL/uL — ABNORMAL LOW (ref 3.87–5.11)
WBC: 7.9 10*3/uL (ref 4.0–10.5)

## 2011-11-15 LAB — RPR: RPR Ser Ql: NONREACTIVE

## 2011-11-15 MED ORDER — LIDOCAINE HCL (PF) 1 % IJ SOLN
30.0000 mL | INTRAMUSCULAR | Status: DC | PRN
  Filled 2011-11-15: qty 30

## 2011-11-15 MED ORDER — OXYCODONE-ACETAMINOPHEN 5-325 MG PO TABS: 1.0000 | ORAL_TABLET | ORAL | Status: DC | PRN

## 2011-11-15 MED ORDER — OXYTOCIN 40 UNITS IN LACTATED RINGERS INFUSION - SIMPLE MED
62.5000 mL/h | INTRAVENOUS | Status: DC
  Administered 2011-11-15: 62.5 mL/h via INTRAVENOUS

## 2011-11-15 MED ORDER — IBUPROFEN 600 MG PO TABS
600.0000 mg | ORAL_TABLET | Freq: Four times a day (QID) | ORAL | Status: DC | PRN
  Administered 2011-11-15: 600 mg via ORAL
  Filled 2011-11-15: qty 1

## 2011-11-15 MED ORDER — ONDANSETRON HCL 4 MG/2ML IJ SOLN: 4.0000 mg | Freq: Four times a day (QID) | INTRAMUSCULAR | Status: DC | PRN

## 2011-11-15 MED ORDER — PHENYLEPHRINE 40 MCG/ML (10ML) SYRINGE FOR IV PUSH (FOR BLOOD PRESSURE SUPPORT)
80.0000 ug | PREFILLED_SYRINGE | INTRAVENOUS | Status: DC | PRN
  Filled 2011-11-15: qty 5

## 2011-11-15 MED ORDER — LACTATED RINGERS IV SOLN: 500.0000 mL | INTRAVENOUS | Status: DC | PRN

## 2011-11-15 MED ORDER — ACETAMINOPHEN 325 MG PO TABS: 650.0000 mg | ORAL_TABLET | ORAL | Status: DC | PRN

## 2011-11-15 MED ORDER — PHENYLEPHRINE 40 MCG/ML (10ML) SYRINGE FOR IV PUSH (FOR BLOOD PRESSURE SUPPORT): 80.0000 ug | PREFILLED_SYRINGE | INTRAVENOUS | Status: DC | PRN

## 2011-11-15 MED ORDER — TERBUTALINE SULFATE 1 MG/ML IJ SOLN: 0.2500 mg | Freq: Once | INTRAMUSCULAR | Status: DC | PRN

## 2011-11-15 MED ORDER — FENTANYL 2.5 MCG/ML BUPIVACAINE 1/10 % EPIDURAL INFUSION (WH - ANES)
14.0000 mL/h | INTRAMUSCULAR | Status: DC
  Administered 2011-11-15: 14 mL/h via EPIDURAL
  Filled 2011-11-15 (×2): qty 125

## 2011-11-15 MED ORDER — BUTORPHANOL TARTRATE 1 MG/ML IJ SOLN: 1.0000 mg | INTRAMUSCULAR | Status: DC | PRN

## 2011-11-15 MED ORDER — LACTATED RINGERS IV SOLN
500.0000 mL | Freq: Once | INTRAVENOUS | Status: AC
  Administered 2011-11-15: 1000 mL via INTRAVENOUS

## 2011-11-15 MED ORDER — EPHEDRINE 5 MG/ML INJ
10.0000 mg | INTRAVENOUS | Status: DC | PRN
  Filled 2011-11-15: qty 4

## 2011-11-15 MED ORDER — OXYTOCIN BOLUS FROM INFUSION
500.0000 mL | INTRAVENOUS | Status: DC
  Administered 2011-11-15: 500 mL via INTRAVENOUS

## 2011-11-15 MED ORDER — FLEET ENEMA 7-19 GM/118ML RE ENEM: 1.0000 | ENEMA | Freq: Every day | RECTAL | Status: DC | PRN

## 2011-11-15 MED ORDER — DIPHENHYDRAMINE HCL 50 MG/ML IJ SOLN
12.5000 mg | INTRAMUSCULAR | Status: DC | PRN
  Administered 2011-11-15: 12.5 mg via INTRAVENOUS
  Filled 2011-11-15: qty 1

## 2011-11-15 MED ORDER — CITRIC ACID-SODIUM CITRATE 334-500 MG/5ML PO SOLN
30.0000 mL | ORAL | Status: DC | PRN
  Administered 2011-11-15: 30 mL via ORAL
  Filled 2011-11-15: qty 15

## 2011-11-15 MED ORDER — EPHEDRINE 5 MG/ML INJ: 10.0000 mg | INTRAVENOUS | Status: DC | PRN

## 2011-11-15 MED ORDER — LACTATED RINGERS IV SOLN
INTRAVENOUS | Status: DC
  Administered 2011-11-15 (×3): via INTRAVENOUS

## 2011-11-15 MED ORDER — OXYTOCIN 40 UNITS IN LACTATED RINGERS INFUSION - SIMPLE MED
1.0000 m[IU]/min | INTRAVENOUS | Status: DC
  Administered 2011-11-15: 7 m[IU]/min via INTRAVENOUS
  Administered 2011-11-15: 1 m[IU]/min via INTRAVENOUS
  Administered 2011-11-15: 3 m[IU]/min via INTRAVENOUS
  Administered 2011-11-15: 4 m[IU]/min via INTRAVENOUS
  Administered 2011-11-15: 6 m[IU]/min via INTRAVENOUS
  Administered 2011-11-15: 5 m[IU]/min via INTRAVENOUS
  Administered 2011-11-15: 8 m[IU]/min via INTRAVENOUS
  Filled 2011-11-15: qty 1000

## 2011-11-15 MED ORDER — FENTANYL 2.5 MCG/ML BUPIVACAINE 1/10 % EPIDURAL INFUSION (WH - ANES)
INTRAMUSCULAR | Status: DC | PRN
  Administered 2011-11-15: 9 mL/h via EPIDURAL

## 2011-11-15 NOTE — H&P (Signed)
This is Dr. Francoise Ceo dictating the history and physical on  Meagan Burgess she's a 21 year old gravida 2 para 1001 at 17 weeks her EDC is 11/22/2011 negative GBS and she desires induction cervix 1 cm 85% -1 station amniotomy performed fluid clear she is on low-dose Pitocin having irregular contractions Past medical history negative Past surgical history negative Social history noncontributory Family history negative System review noncontributory Physical exam well-developed female in no distress HEENT negative Breasts negative Heart regular rhythm no murmurs no gallops Lungs clear to p and a Abdomen term estimated fetal weight 6 lbs. 4 oz. Pelvic as described above Extremities negative

## 2011-11-15 NOTE — Anesthesia Preprocedure Evaluation (Signed)
Anesthesia Evaluation  Patient identified by MRN, date of birth, ID band Patient awake    Reviewed: Allergy & Precautions, H&P , NPO status , Patient's Chart, lab work & pertinent test results  Airway Mallampati: I TM Distance: >3 FB Neck ROM: full    Dental No notable dental hx.    Pulmonary neg pulmonary ROS,    Pulmonary exam normal       Cardiovascular negative cardio ROS      Neuro/Psych negative neurological ROS  negative psych ROS   GI/Hepatic negative GI ROS, Neg liver ROS,   Endo/Other  negative endocrine ROS  Renal/GU negative Renal ROS     Musculoskeletal negative musculoskeletal ROS (+)   Abdominal Normal abdominal exam  (+)   Peds negative pediatric ROS (+)  Hematology negative hematology ROS (+)   Anesthesia Other Findings   Reproductive/Obstetrics (+) Pregnancy                           Anesthesia Physical Anesthesia Plan  ASA: II  Anesthesia Plan: Epidural   Post-op Pain Management:    Induction:   Airway Management Planned:   Additional Equipment:   Intra-op Plan:   Post-operative Plan:   Informed Consent: I have reviewed the patients History and Physical, chart, labs and discussed the procedure including the risks, benefits and alternatives for the proposed anesthesia with the patient or authorized representative who has indicated his/her understanding and acceptance.     Plan Discussed with:   Anesthesia Plan Comments:         Anesthesia Quick Evaluation  

## 2011-11-15 NOTE — Anesthesia Procedure Notes (Signed)
Epidural Patient location during procedure: OB Start time: 11/15/2011 12:42 PM End time: 11/15/2011 12:46 PM  Staffing Anesthesiologist: Sandrea Hughs  Preanesthetic Checklist Completed: patient identified, site marked, surgical consent, pre-op evaluation, timeout performed, IV checked, risks and benefits discussed and monitors and equipment checked  Epidural Patient position: sitting Prep: site prepped and draped and DuraPrep Patient monitoring: continuous pulse ox and blood pressure Approach: midline Injection technique: LOR air  Needle:  Needle type: Tuohy  Needle gauge: 17 G Needle length: 9 cm and 9 Needle insertion depth: 4 cm Catheter type: closed end flexible Catheter size: 19 Gauge Catheter at skin depth: 9 cm Test dose: negative and Other  Assessment Sensory level: T8 Events: blood not aspirated, injection not painful, no injection resistance, negative IV test and no paresthesia  Additional Notes Reason for block:procedure for pain

## 2011-11-16 ENCOUNTER — Encounter (HOSPITAL_COMMUNITY): Payer: Self-pay

## 2011-11-16 LAB — CBC
MCH: 28.5 pg (ref 26.0–34.0)
MCHC: 32.4 g/dL (ref 30.0–36.0)
Platelets: 176 10*3/uL (ref 150–400)
RBC: 3.23 MIL/uL — ABNORMAL LOW (ref 3.87–5.11)
RDW: 12.7 % (ref 11.5–15.5)

## 2011-11-16 MED ORDER — LANOLIN HYDROUS EX OINT: TOPICAL_OINTMENT | CUTANEOUS | Status: DC | PRN

## 2011-11-16 MED ORDER — SENNOSIDES-DOCUSATE SODIUM 8.6-50 MG PO TABS
2.0000 | ORAL_TABLET | Freq: Every day | ORAL | Status: DC
  Administered 2011-11-16: 2 via ORAL

## 2011-11-16 MED ORDER — DIPHENHYDRAMINE HCL 25 MG PO CAPS: 25.0000 mg | ORAL_CAPSULE | Freq: Four times a day (QID) | ORAL | Status: DC | PRN

## 2011-11-16 MED ORDER — INFLUENZA VIRUS VACC SPLIT PF IM SUSP: 0.5000 mL | INTRAMUSCULAR | Status: DC

## 2011-11-16 MED ORDER — PNEUMOCOCCAL VAC POLYVALENT 25 MCG/0.5ML IJ INJ
0.5000 mL | INJECTION | INTRAMUSCULAR | Status: AC
  Administered 2011-11-17: 0.5 mL via INTRAMUSCULAR
  Filled 2011-11-16: qty 0.5

## 2011-11-16 MED ORDER — IBUPROFEN 600 MG PO TABS
600.0000 mg | ORAL_TABLET | Freq: Four times a day (QID) | ORAL | Status: DC
  Administered 2011-11-16 – 2011-11-17 (×5): 600 mg via ORAL
  Filled 2011-11-16 (×5): qty 1

## 2011-11-16 MED ORDER — ONDANSETRON HCL 4 MG/2ML IJ SOLN: 4.0000 mg | INTRAMUSCULAR | Status: DC | PRN

## 2011-11-16 MED ORDER — TETANUS-DIPHTH-ACELL PERTUSSIS 5-2.5-18.5 LF-MCG/0.5 IM SUSP: 0.5000 mL | Freq: Once | INTRAMUSCULAR | Status: DC

## 2011-11-16 MED ORDER — WITCH HAZEL-GLYCERIN EX PADS: 1.0000 "application " | MEDICATED_PAD | CUTANEOUS | Status: DC | PRN

## 2011-11-16 MED ORDER — FERROUS SULFATE 325 (65 FE) MG PO TABS
325.0000 mg | ORAL_TABLET | Freq: Two times a day (BID) | ORAL | Status: DC
  Administered 2011-11-16 – 2011-11-17 (×3): 325 mg via ORAL
  Filled 2011-11-16 (×4): qty 1

## 2011-11-16 MED ORDER — PRENATAL MULTIVITAMIN CH
1.0000 | ORAL_TABLET | Freq: Every day | ORAL | Status: DC
  Administered 2011-11-16 – 2011-11-17 (×2): 1 via ORAL
  Filled 2011-11-16 (×2): qty 1

## 2011-11-16 MED ORDER — BENZOCAINE-MENTHOL 20-0.5 % EX AERO: 1.0000 "application " | INHALATION_SPRAY | CUTANEOUS | Status: DC | PRN

## 2011-11-16 MED ORDER — ZOLPIDEM TARTRATE 5 MG PO TABS: 5.0000 mg | ORAL_TABLET | Freq: Every evening | ORAL | Status: DC | PRN

## 2011-11-16 MED ORDER — ONDANSETRON HCL 4 MG PO TABS: 4.0000 mg | ORAL_TABLET | ORAL | Status: DC | PRN

## 2011-11-16 MED ORDER — DIBUCAINE 1 % RE OINT: 1.0000 "application " | TOPICAL_OINTMENT | RECTAL | Status: DC | PRN

## 2011-11-16 MED ORDER — SIMETHICONE 80 MG PO CHEW: 80.0000 mg | CHEWABLE_TABLET | ORAL | Status: DC | PRN

## 2011-11-16 MED ORDER — INFLUENZA VIRUS VACC SPLIT PF IM SUSP
0.5000 mL | INTRAMUSCULAR | Status: AC
  Administered 2011-11-16: 0.5 mL via INTRAMUSCULAR

## 2011-11-16 MED ORDER — OXYCODONE-ACETAMINOPHEN 5-325 MG PO TABS
1.0000 | ORAL_TABLET | ORAL | Status: DC | PRN
  Administered 2011-11-16 (×3): 1 via ORAL
  Filled 2011-11-16 (×3): qty 1

## 2011-11-16 NOTE — Anesthesia Postprocedure Evaluation (Signed)
  Anesthesia Post-op Note  Patient: Meagan Burgess  Procedure(s) Performed: * No procedures listed *  Patient Location: PACU and Mother/Baby  Anesthesia Type:Epidural  Level of Consciousness: awake, alert  and oriented  Airway and Oxygen Therapy: Patient Spontanous Breathing  Post-op Pain: mild  Post-op Assessment: Post-op Vital signs reviewed  Post-op Vital Signs: Reviewed and stable  Complications: No apparent anesthesia complications

## 2011-11-16 NOTE — Progress Notes (Signed)
UR done. 

## 2011-11-16 NOTE — Progress Notes (Signed)
Patient ID: Meagan Burgess, female   DOB: Feb 08, 1990, 21 y.o.   MRN: [id number] Postpartum day one Vital signs normal Fundus firm Lochia moderate No complaints

## 2011-11-17 NOTE — Discharge Summary (Signed)
Obstetric Discharge Summary Reason for Admission: induction of labor Prenatal Procedures: none Intrapartum Procedures: spontaneous vaginal delivery Postpartum Procedures: none Complications-Operative and Postpartum: none Hemoglobin  Date Value Range Status  11/16/2011 9.2* 12.0 - 15.0 g/dL Final     HCT  Date Value Range Status  11/16/2011 28.4* 36.0 - 46.0 % Final    Physical Exam:  General: alert Lochia: appropriate Uterine Fundus: firm Incision: healing well DVT Evaluation: No evidence of DVT seen on physical exam.  Discharge Diagnoses: Term Pregnancy-delivered  Discharge Information: Date: 11/17/2011 Activity: pelvic rest Diet: routine Medications: Percocet Condition: stable Instructions: refer to practice specific booklet Discharge to: home Follow-up Information    Call in 6 weeks to follow up.   Contact information:   b marshall         Newborn Data: Live born female  Birth Weight: 6 lb 4 oz (2835 g) APGAR: 9, 9  Home with mother.  MARSHALL,BERNARD A 11/17/2011, 7:26 AM

## 2012-02-29 ENCOUNTER — Encounter (HOSPITAL_COMMUNITY): Payer: Self-pay | Admitting: Nurse Practitioner

## 2012-02-29 ENCOUNTER — Emergency Department (HOSPITAL_COMMUNITY)
Admission: EM | Admit: 2012-02-29 | Discharge: 2012-02-29 | Disposition: A | Discharge status: Home / Self Care | Payer: Medicaid Other | Attending: Emergency Medicine | Admitting: Emergency Medicine

## 2012-02-29 ENCOUNTER — Emergency Department (HOSPITAL_COMMUNITY): Payer: Medicaid Other

## 2012-02-29 DIAGNOSIS — Z87440 Personal history of urinary (tract) infections: Secondary | ICD-10-CM | POA: Insufficient documentation

## 2012-02-29 DIAGNOSIS — Z8742 Personal history of other diseases of the female genital tract: Secondary | ICD-10-CM | POA: Insufficient documentation

## 2012-02-29 DIAGNOSIS — R071 Chest pain on breathing: Secondary | ICD-10-CM | POA: Insufficient documentation

## 2012-02-29 DIAGNOSIS — Z791 Long term (current) use of non-steroidal anti-inflammatories (NSAID): Secondary | ICD-10-CM | POA: Insufficient documentation

## 2012-02-29 DIAGNOSIS — Z87891 Personal history of nicotine dependence: Secondary | ICD-10-CM | POA: Insufficient documentation

## 2012-02-29 DIAGNOSIS — R0789 Other chest pain: Secondary | ICD-10-CM

## 2012-02-29 LAB — URINALYSIS, ROUTINE W REFLEX MICROSCOPIC
Glucose, UA: NEGATIVE mg/dL
Nitrite: NEGATIVE
Specific Gravity, Urine: 1.022 (ref 1.005–1.030)
pH: 6 (ref 5.0–8.0)

## 2012-02-29 LAB — URINE MICROSCOPIC-ADD ON

## 2012-02-29 MED ORDER — IBUPROFEN 600 MG PO TABS: 600.0000 mg | ORAL_TABLET | Freq: Three times a day (TID) | ORAL | Status: DC

## 2012-02-29 MED ORDER — TRAMADOL HCL 50 MG PO TABS: 50.0000 mg | ORAL_TABLET | Freq: Four times a day (QID) | ORAL | Status: DC | PRN

## 2012-02-29 NOTE — ED Notes (Signed)
Per patient: having sharp pains in right chest about 3 weeks ago that radiated to the back.  Pt denies any N/V and pain is currently not radiating to the back.  Pt had vaginal delivery 11/15/11 that resulted in lower back pain.  Pt's MD ordered percocet after delivery. Pt took one percocet at onset of chest pain and it alleviate the pain.  Pt is taking Ibuprofen and tylenol during the past three months without much relief.  Mom is here with baby at bedside.  7/10 pain currently..the patient took two extra strenght Tylenols around 1600 today.

## 2012-02-29 NOTE — ED Provider Notes (Signed)
History  This chart was scribed for Loren Racer, MD by Bennett Scrape, ED Scribe,  Burman Nieves, ED Scribe. This patient was seen in room WA22/WA22 and the patient's care was started at 4:41 PM.  CSN: [id number]  Arrival date & time 02/29/12  1622   First MD Initiated Contact with Patient 02/29/12 1641      Chief Complaint  Patient presents with  . Chest Pain     Patient is a 22 y.o. female presenting with chest pain. The history is provided by the patient. No language interpreter was used.  Chest Pain Pain location:  R chest Pain radiates to:  Does not radiate Pain radiates to the back: no   Pain severity:  Mild Onset quality:  Gradual Duration:  3 weeks Timing:  Constant Progression:  Worsening Chronicity:  New Worsened by:  Coughing Associated symptoms: no abdominal pain, no back pain, no fever, no nausea and not vomiting   Risk factors: no diabetes mellitus and no hypertension     Meagan Burgess is a 22 y.o. female who presents to the Emergency Department complaining of moderate constant right chest pain onset for three weeks. Pain was exacerbated with coughing and hiccupping. She reports that when she takes percocet, which were prescribed for other symptoms, pain subsides. She reports that ibuprofen and tylenol did not relieve of symptoms. Pt denies any recent trauma to the chest, but reports she takes care of two small children at home. Pt denies fever, chills, cough, nausea, vomiting, diarrhea, SOB, weakness, and any other associated symptoms.    Past Medical History  Diagnosis Date  . Urinary tract infection   . Abnormal Pap smear     colpo- wnl    Past Surgical History  Procedure Laterality Date  . No past surgeries      Family History  Problem Relation Age of Onset  . Anesthesia problems Neg Hx   . Hypertension Father   . Hypertension Maternal Grandmother   . Cancer Maternal Grandfather   . Diabetes Paternal Grandmother     History  Substance  Use Topics  . Smoking status: Former Smoker -- 0.25 packs/day for 6 years    Types: Cigarettes    Quit date: 04/26/2011  . Smokeless tobacco: Never Used  . Alcohol Use: No    OB History   Grav Para Term Preterm Abortions TAB SAB Ect Mult Living   2 2 2  0 0 0 0 0 0 2      Review of Systems  Constitutional: Negative for fever and chills.  Cardiovascular: Positive for chest pain. Negative for leg swelling.  Gastrointestinal: Negative for nausea, vomiting and abdominal pain.  Musculoskeletal: Negative for back pain.  All other systems reviewed and are negative.    Allergies  Review of patient's allergies indicates no known allergies.  Home Medications   Current Outpatient Rx  Name  Route  Sig  Dispense  Refill  . acetaminophen (TYLENOL) 500 MG tablet   Oral   Take 1,000 mg by mouth every 6 (six) hours as needed for pain.         Marland Kitchen ibuprofen (ADVIL,MOTRIN) 200 MG tablet   Oral   Take 600 mg by mouth every 8 (eight) hours as needed for pain.         Marland Kitchen ibuprofen (ADVIL,MOTRIN) 600 MG tablet   Oral   Take 1 tablet (600 mg total) by mouth 3 (three) times daily.   30 tablet   0   .  traMADol (ULTRAM) 50 MG tablet   Oral   Take 1 tablet (50 mg total) by mouth every 6 (six) hours as needed for pain.   15 tablet   0     Triage Vitals: BP 111/67  Pulse 68  Temp(Src) 98.2 F (36.8 C) (Oral)  Resp 15  SpO2 97%  LMP 02/15/2012  Breastfeeding? No  Physical Exam  Nursing note and vitals reviewed. Constitutional: She is oriented to person, place, and time. She appears well-developed and well-nourished. No distress.  HENT:  Head: Normocephalic and atraumatic.  Eyes: Conjunctivae and EOM are normal.  Neck: Neck supple. No tracheal deviation present.  Cardiovascular: Normal rate and regular rhythm.  Exam reveals no gallop and no friction rub.   No murmur heard. Pulmonary/Chest: Effort normal and breath sounds normal. No respiratory distress. She exhibits tenderness.   reproducible tenderness in the  upper right chest wall, no deformity, no creppitus  Abdominal: Soft. There is no tenderness.  Musculoskeletal: Normal range of motion.     Neurological: She is alert and oriented to person, place, and time.  Skin: Skin is warm and dry.  Psychiatric: She has a normal mood and affect. Her behavior is normal.    ED Course  Procedures (including critical care time)  DIAGNOSTIC STUDIES: Oxygen Saturation is 97% on room air, adequate by my interpretation.    COORDINATION OF CARE: 5:25 PM Discussed ED treatment with pt and pt agrees. Informed pt that EKG was normal. Advised pt that her sx were not heart or lung related.  7:21 PM-Informed pt of radiology and lab work results. Discussed discharge plan which includes taking regularly scheduled ibuprofen and following up with PCP with pt and pt agreed to plan. Also advised pt to return for development of fever, chills, SOB.  Labs Reviewed  URINALYSIS, ROUTINE W REFLEX MICROSCOPIC - Abnormal; Notable for the following:    Leukocytes, UA SMALL (*)    All other components within normal limits  URINE MICROSCOPIC-ADD ON - Abnormal; Notable for the following:    Squamous Epithelial / LPF FEW (*)    Bacteria, UA FEW (*)    All other components within normal limits    Dg Chest 2 View  02/29/2012  *RADIOLOGY REPORT*  Clinical Data: Right chest pain.  CHEST - 2 VIEW  Comparison: None  Findings: Heart and mediastinal contours are within normal limits. No focal opacities or effusions.  No acute bony abnormality.  IMPRESSION: No active cardiopulmonary disease.   Original Report Authenticated By: Charlett Nose, M.D.      1. Right-sided chest wall pain      Date: 02/29/2012  Rate: 75  Rhythm: normal sinus rhythm  QRS Axis: normal  Intervals: normal  ST/T Wave abnormalities: normal  Conduction Disutrbances:none  Narrative Interpretation:   Old EKG Reviewed: none available    MDM  I personally performed the  services described in this documentation, which was scribed in my presence. The recorded information has been reviewed and is accurate.    Loren Racer, MD 02/29/12 [phone]

## 2012-02-29 NOTE — Progress Notes (Signed)
CM Confirmed with pt that pcp is [facility] updated

## 2012-08-11 ENCOUNTER — Encounter (HOSPITAL_COMMUNITY): Payer: Self-pay

## 2012-08-11 ENCOUNTER — Emergency Department (HOSPITAL_COMMUNITY)
Admission: EM | Admit: 2012-08-11 | Discharge: 2012-08-11 | Disposition: A | Discharge status: Home / Self Care | Payer: Medicaid Other | Attending: Emergency Medicine | Admitting: Emergency Medicine

## 2012-08-11 DIAGNOSIS — Z87440 Personal history of urinary (tract) infections: Secondary | ICD-10-CM | POA: Insufficient documentation

## 2012-08-11 DIAGNOSIS — R3989 Other symptoms and signs involving the genitourinary system: Secondary | ICD-10-CM | POA: Insufficient documentation

## 2012-08-11 DIAGNOSIS — Z87891 Personal history of nicotine dependence: Secondary | ICD-10-CM | POA: Insufficient documentation

## 2012-08-11 DIAGNOSIS — A6000 Herpesviral infection of urogenital system, unspecified: Secondary | ICD-10-CM | POA: Insufficient documentation

## 2012-08-11 DIAGNOSIS — A599 Trichomoniasis, unspecified: Secondary | ICD-10-CM | POA: Insufficient documentation

## 2012-08-11 DIAGNOSIS — B9689 Other specified bacterial agents as the cause of diseases classified elsewhere: Secondary | ICD-10-CM | POA: Insufficient documentation

## 2012-08-11 DIAGNOSIS — N760 Acute vaginitis: Secondary | ICD-10-CM

## 2012-08-11 DIAGNOSIS — A499 Bacterial infection, unspecified: Secondary | ICD-10-CM | POA: Insufficient documentation

## 2012-08-11 DIAGNOSIS — Z3202 Encounter for pregnancy test, result negative: Secondary | ICD-10-CM | POA: Insufficient documentation

## 2012-08-11 LAB — URINALYSIS, ROUTINE W REFLEX MICROSCOPIC
Bilirubin Urine: NEGATIVE
Ketones, ur: NEGATIVE mg/dL
Nitrite: NEGATIVE
Protein, ur: NEGATIVE mg/dL
Specific Gravity, Urine: 1.012 (ref 1.005–1.030)
Urobilinogen, UA: 1 mg/dL (ref 0.0–1.0)

## 2012-08-11 LAB — RPR: RPR Ser Ql: NONREACTIVE

## 2012-08-11 LAB — WET PREP, GENITAL

## 2012-08-11 LAB — URINE MICROSCOPIC-ADD ON

## 2012-08-11 LAB — POCT PREGNANCY, URINE: Preg Test, Ur: NEGATIVE

## 2012-08-11 MED ORDER — ACYCLOVIR 400 MG PO TABS: 400.0000 mg | ORAL_TABLET | Freq: Four times a day (QID) | ORAL | Status: DC

## 2012-08-11 MED ORDER — ACYCLOVIR 200 MG PO CAPS
400.0000 mg | ORAL_CAPSULE | Freq: Once | ORAL | Status: AC
  Administered 2012-08-11: 400 mg via ORAL
  Filled 2012-08-11: qty 2

## 2012-08-11 MED ORDER — HYDROCODONE-ACETAMINOPHEN 5-325 MG PO TABS
1.0000 | ORAL_TABLET | Freq: Once | ORAL | Status: AC
  Administered 2012-08-11: 1 via ORAL
  Filled 2012-08-11: qty 1

## 2012-08-11 MED ORDER — METRONIDAZOLE 500 MG PO TABS: 500.0000 mg | ORAL_TABLET | Freq: Two times a day (BID) | ORAL | Status: DC

## 2012-08-11 MED ORDER — AZITHROMYCIN 250 MG PO TABS
1000.0000 mg | ORAL_TABLET | Freq: Once | ORAL | Status: AC
  Administered 2012-08-11: 1000 mg via ORAL
  Filled 2012-08-11: qty 4

## 2012-08-11 MED ORDER — HYDROCODONE-ACETAMINOPHEN 5-325 MG PO TABS: ORAL_TABLET | ORAL | Status: DC

## 2012-08-11 MED ORDER — METRONIDAZOLE 500 MG PO TABS
2000.0000 mg | ORAL_TABLET | Freq: Once | ORAL | Status: AC
  Administered 2012-08-11: 2000 mg via ORAL
  Filled 2012-08-11: qty 4

## 2012-08-11 MED ORDER — METRONIDAZOLE 500 MG PO TABS: 500.0000 mg | ORAL_TABLET | Freq: Once | ORAL | Status: DC

## 2012-08-11 MED ORDER — CEFTRIAXONE SODIUM 250 MG IJ SOLR
250.0000 mg | INTRAMUSCULAR | Status: DC
  Administered 2012-08-11: 250 mg via INTRAMUSCULAR
  Filled 2012-08-11: qty 250

## 2012-08-11 NOTE — ED Notes (Signed)
Pt is back in room

## 2012-08-11 NOTE — ED Provider Notes (Signed)
Medical screening examination/treatment/procedure(s) were conducted as a shared visit with non-physician practitioner(s) and myself.  I personally evaluated the patient during the encounter and agree with the exam and plan of care.  Pt with "leaking urine" for several days.  No UTI.  Not pregnant.  Pelvic shows copious vag dc and strawberry cervix.  No CMT however.  Will treat for cervicitis.    Pt has BV and trichomonas - will treat with Flagyl. We'll also treat with acyclovir for her herpes-like lesions. HIV, RPR and herpes testing pending. Patient is aware that her partner will also need to be treated.  Meagan Maw Karim Aiello, DO 08/11/12 1655
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
## 2012-08-11 NOTE — ED Notes (Signed)
Pt states that she has had "leaking" urine for a week.  Pt denies painful urination.  Pt also states she is "raw and inflamed down there".

## 2012-08-12 LAB — GC/CHLAMYDIA PROBE AMP
CT Probe RNA: NEGATIVE
GC Probe RNA: NEGATIVE

## 2012-08-13 LAB — HERPES SIMPLEX VIRUS CULTURE

## 2013-07-05 ENCOUNTER — Emergency Department (HOSPITAL_COMMUNITY)
Admission: EM | Admit: 2013-07-05 | Discharge: 2013-07-05 | Disposition: A | Discharge status: Home / Self Care | Payer: Medicaid Other

## 2013-07-05 NOTE — ED Notes (Signed)
RN attempted to pull patient to triage. Pt hand RN pager and stated she "had to go out to her car for a second." RN attempted to convince patient to stay and be triaged, pt refused. RN witnessed patient walk out the entrance.

## 2013-07-05 NOTE — ED Notes (Signed)
Pt ran out to car but gave pager back to nurse and waiting to return

## 2013-07-05 NOTE — ED Notes (Signed)
Call No Answer times 3

## 2013-07-05 NOTE — ED Notes (Signed)
Call no answer 

## 2013-11-04 ENCOUNTER — Encounter (HOSPITAL_COMMUNITY): Payer: Self-pay

## 2014-06-13 ENCOUNTER — Emergency Department (HOSPITAL_COMMUNITY)
Admission: EM | Admit: 2014-06-13 | Discharge: 2014-06-13 | Disposition: A | Discharge status: Home / Self Care | Payer: Medicaid Other

## 2014-06-13 NOTE — ED Notes (Signed)
No response from lobby 

## 2014-06-13 NOTE — ED Notes (Signed)
No answer in lobby.

## 2014-10-13 ENCOUNTER — Emergency Department (HOSPITAL_COMMUNITY)
Admission: EM | Admit: 2014-10-13 | Discharge: 2014-10-13 | Discharge status: Against Medical Advice | Payer: Medicaid Other | Source: Home / Self Care

## 2014-10-13 NOTE — ED Notes (Signed)
No answer x2 in lobby  

## 2014-10-28 LAB — OB RESULTS CONSOLE RPR: RPR: NONREACTIVE

## 2014-10-28 LAB — OB RESULTS CONSOLE HEPATITIS B SURFACE ANTIGEN: HEP B S AG: NEGATIVE

## 2014-10-28 LAB — OB RESULTS CONSOLE ABO/RH

## 2014-10-28 LAB — OB RESULTS CONSOLE GC/CHLAMYDIA
Chlamydia: NEGATIVE
Gonorrhea: NEGATIVE

## 2014-10-28 LAB — OB RESULTS CONSOLE HIV ANTIBODY (ROUTINE TESTING): HIV: NONREACTIVE

## 2014-10-28 LAB — OB RESULTS CONSOLE ANTIBODY SCREEN: Antibody Screen: NEGATIVE

## 2014-10-28 LAB — OB RESULTS CONSOLE RUBELLA ANTIBODY, IGM: Rubella: IMMUNE

## 2014-11-02 ENCOUNTER — Encounter (HOSPITAL_COMMUNITY): Payer: Self-pay | Admitting: *Deleted

## 2014-11-02 ENCOUNTER — Inpatient Hospital Stay (HOSPITAL_COMMUNITY)
Admission: AD | Admit: 2014-11-02 | Discharge: 2014-11-02 | Disposition: A | Discharge status: Home / Self Care | Payer: Medicaid Other | Source: Ambulatory Visit | Attending: Obstetrics | Admitting: Obstetrics

## 2014-11-02 DIAGNOSIS — F17210 Nicotine dependence, cigarettes, uncomplicated: Secondary | ICD-10-CM | POA: Insufficient documentation

## 2014-11-02 DIAGNOSIS — O26893 Other specified pregnancy related conditions, third trimester: Secondary | ICD-10-CM | POA: Diagnosis not present

## 2014-11-02 DIAGNOSIS — O26899 Other specified pregnancy related conditions, unspecified trimester: Secondary | ICD-10-CM

## 2014-11-02 DIAGNOSIS — R109 Unspecified abdominal pain: Secondary | ICD-10-CM | POA: Insufficient documentation

## 2014-11-02 DIAGNOSIS — W228XXA Striking against or struck by other objects, initial encounter: Secondary | ICD-10-CM | POA: Diagnosis not present

## 2014-11-02 DIAGNOSIS — Z3A13 13 weeks gestation of pregnancy: Secondary | ICD-10-CM | POA: Insufficient documentation

## 2014-11-02 DIAGNOSIS — O99331 Smoking (tobacco) complicating pregnancy, first trimester: Secondary | ICD-10-CM | POA: Insufficient documentation

## 2014-11-02 DIAGNOSIS — O9989 Other specified diseases and conditions complicating pregnancy, childbirth and the puerperium: Secondary | ICD-10-CM
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
## 2014-11-02 NOTE — Discharge Instructions (Signed)
Take Tylenol 325 mg 2 tablets by mouth every 4 hours if needed for pain. Drink at least 8 8-oz glasses of water every day. Return if you have worsening or increasing vaginal bleeding. Return if your abdominal pain worsens.

## 2014-11-02 NOTE — MAU Provider Note (Signed)
History     CSN: [id number]  Arrival date and time: 11/02/14 1103   [phone] by provider at 1120    Chief Complaint  Patient presents with  . Abdominal Cramping   HPI Feliberto Hartsriscilla A Dunnaway 24 y.o.  [id number]  Client has been seen by provider for prenatal care - Dr. Gaynell FaceMarshall.  Was at work today and was hit by a door handle in the upper abdomen - above the umbilicus.  Noticed some pink discharge when wiping once.  No further bleeding but was encouraged by coworkers and her partner to come to the hospital to be checked.  Has some lower abdominal cramping periodically.  Is not in pain currently and reports no current vaginal bleeding.  Is not able to give a urine specimen and wants to go back to work.    OB History    Gravida Para Term Preterm AB TAB SAB Ectopic Multiple Living   3 2 2  0 0 0 0 0 0 2      Past Medical History  Diagnosis Date  . Urinary tract infection   . Abnormal Pap smear     colpo- wnl    Past Surgical History  Procedure Laterality Date  . No past surgeries      Family History  Problem Relation Age of Onset  . Anesthesia problems Neg Hx   . Hypertension Father   . Hypertension Maternal Grandmother   . Cancer Maternal Grandfather   . Diabetes Paternal Grandmother     Social History  Substance Use Topics  . Smoking status: Current Every Day Smoker -- 0.25 packs/day for 6 years    Types: Cigarettes    Last Attempt to Quit: 04/26/2011  . Smokeless tobacco: Never Used  . Alcohol Use: No    Allergies: No Known Allergies  Prescriptions prior to admission  Medication Sig Dispense Refill Last Dose  . Prenat w/o A-[id number]-DHA (CITRANATAL 90 DHA PO) Take 1 tablet by mouth daily.   Past Week at Unknown time  . acetaminophen (TYLENOL) 500 MG tablet Take 1,000 mg by mouth every 6 (six) hours as needed for pain.   prn  . acyclovir (ZOVIRAX) 400 MG tablet Take 1 tablet (400 mg total) by mouth 4 (four) times daily. (Patient not taking: Reported on 11/02/2014) 40  tablet 0 Not Taking at Unknown time  . HYDROcodone-acetaminophen (NORCO/VICODIN) 5-325 MG per tablet Take 1-2 tablets by mouth every 6 hours as needed for pain. (Patient not taking: Reported on 11/02/2014) 15 tablet 0 Not Taking at Unknown time  . metroNIDAZOLE (FLAGYL) 500 MG tablet Take 1 tablet (500 mg total) by mouth 2 (two) times daily. One tab PO bid x 10 days (Patient not taking: Reported on 11/02/2014) 20 tablet 0 Not Taking at Unknown time    Review of Systems  Constitutional: Negative for fever.  Gastrointestinal: Positive for abdominal pain.       Some lower abdominal cramping   Genitourinary:       No vaginal discharge. Pink vaginal bleeding noted when wiping. No dysuria.   Physical Exam   Blood pressure 124/68, pulse 79, temperature 98.2 F (36.8 C), temperature source Oral, resp. rate 18, height 5\' 1"  (1.549 m), weight 105 lb (47.628 kg).  Physical Exam  Nursing note and vitals reviewed. Constitutional: She is oriented to person, place, and time. She appears well-developed and well-nourished. No distress.  HENT:  Head: Normocephalic.  Eyes: EOM are normal.  Neck: Neck supple.  GI: Soft. There is no tenderness.  Musculoskeletal: Normal range of motion.  Neurological: She is alert and oriented to person, place, and time.  Skin: Skin is warm and dry.  Psychiatric: She has a normal mood and affect.     MAU Course  Procedures  MDM   Assessment and Plan  Abdominal pain in pregnancy likely from inadequate fluid intake. accident to upper abdomen with door handle.   Plan Take Tylenol 325 mg 2 tablets by mouth every 4 hours if needed for pain. Drink at least 8 8-oz glasses of water every day. Expect cramping to decrease as you have appropriate fluids on a daily basis. Return if cramping worsens or if vaginal bleeding worsens. Keep your appointments as scheduled for prenatal care.  BURLESON,TERRI 11/02/2014, 11:38 AM

## 2015-01-04 NOTE — L&D Delivery Note (Signed)
Delivery Note At 9:36 AM a viable female was delivered via  (Presentation: ;  ).  APGAR: 7, 9; weight  .   Placenta status: Intact, Spontaneous.  Cord:  with the following complications: .  Cord pH: not done  Anesthesia:   Episiotomy:   Lacerations:   Suture Repair: 2.0 Est. Blood Loss (mL):    Mom to postpartum.  Baby to Couplet care / Skin to Skin.  MARSHALL,BERNARD A 05/04/2015, 9:51 AM

## 2015-02-09 ENCOUNTER — Emergency Department (HOSPITAL_COMMUNITY)
Admission: EM | Admit: 2015-02-09 | Discharge: 2015-02-10 | Disposition: A | Discharge status: Home / Self Care | Payer: Medicaid Other | Attending: Emergency Medicine | Admitting: Emergency Medicine

## 2015-02-09 ENCOUNTER — Encounter (HOSPITAL_COMMUNITY): Payer: Self-pay | Admitting: Emergency Medicine

## 2015-02-09 DIAGNOSIS — Y998 Other external cause status: Secondary | ICD-10-CM | POA: Diagnosis not present

## 2015-02-09 DIAGNOSIS — F17210 Nicotine dependence, cigarettes, uncomplicated: Secondary | ICD-10-CM | POA: Diagnosis not present

## 2015-02-09 DIAGNOSIS — O99333 Smoking (tobacco) complicating pregnancy, third trimester: Secondary | ICD-10-CM | POA: Insufficient documentation

## 2015-02-09 DIAGNOSIS — O9A213 Injury, poisoning and certain other consequences of external causes complicating pregnancy, third trimester: Secondary | ICD-10-CM | POA: Insufficient documentation

## 2015-02-09 DIAGNOSIS — Y92410 Unspecified street and highway as the place of occurrence of the external cause: Secondary | ICD-10-CM | POA: Insufficient documentation

## 2015-02-09 DIAGNOSIS — Z79899 Other long term (current) drug therapy: Secondary | ICD-10-CM | POA: Insufficient documentation

## 2015-02-09 DIAGNOSIS — Z3A28 28 weeks gestation of pregnancy: Secondary | ICD-10-CM | POA: Diagnosis not present

## 2015-02-09 DIAGNOSIS — Z87440 Personal history of urinary (tract) infections: Secondary | ICD-10-CM | POA: Diagnosis not present

## 2015-02-09 DIAGNOSIS — S3992XA Unspecified injury of lower back, initial encounter: Secondary | ICD-10-CM | POA: Diagnosis not present

## 2015-02-09 DIAGNOSIS — S3991XA Unspecified injury of abdomen, initial encounter: Secondary | ICD-10-CM | POA: Insufficient documentation

## 2015-02-09 DIAGNOSIS — Y9389 Activity, other specified: Secondary | ICD-10-CM | POA: Diagnosis not present

## 2015-02-09 DIAGNOSIS — Z3490 Encounter for supervision of normal pregnancy, unspecified, unspecified trimester: Secondary | ICD-10-CM

## 2015-02-09 LAB — CBC WITH DIFFERENTIAL/PLATELET
BASOS PCT: 0 %
Basophils Absolute: 0 10*3/uL (ref 0.0–0.1)
EOS ABS: 0.2 10*3/uL (ref 0.0–0.7)
EOS PCT: 2 %
HCT: 31.8 % — ABNORMAL LOW (ref 36.0–46.0)
HEMOGLOBIN: 10.7 g/dL — AB (ref 12.0–15.0)
Lymphocytes Relative: 22 %
Lymphs Abs: 2.4 10*3/uL (ref 0.7–4.0)
MCH: 30.6 pg (ref 26.0–34.0)
MCHC: 33.6 g/dL (ref 30.0–36.0)
MCV: 90.9 fL (ref 78.0–100.0)
MONOS PCT: 7 %
Monocytes Absolute: 0.7 10*3/uL (ref 0.1–1.0)
NEUTROS PCT: 69 %
Neutro Abs: 7.8 10*3/uL — ABNORMAL HIGH (ref 1.7–7.7)
PLATELETS: 265 10*3/uL (ref 150–400)
RBC: 3.5 MIL/uL — ABNORMAL LOW (ref 3.87–5.11)
RDW: 11.8 % (ref 11.5–15.5)
WBC: 11.1 10*3/uL — AB (ref 4.0–10.5)

## 2015-02-09 LAB — BASIC METABOLIC PANEL
Anion gap: 13 (ref 5–15)
CALCIUM: 9 mg/dL (ref 8.9–10.3)
CHLORIDE: 104 mmol/L (ref 101–111)
CO2: 23 mmol/L (ref 22–32)
CREATININE: 0.55 mg/dL (ref 0.44–1.00)
Glucose, Bld: 85 mg/dL (ref 65–99)
Potassium: 3.5 mmol/L (ref 3.5–5.1)
SODIUM: 140 mmol/L (ref 135–145)

## 2015-02-09 LAB — WET PREP, GENITAL
Clue Cells Wet Prep HPF POC: NONE SEEN
SPERM: NONE SEEN
TRICH WET PREP: NONE SEEN

## 2015-02-09 LAB — KLEIHAUER-BETKE STAIN
# Vials RhIg: 1
FETAL CELLS %: 0 %
QUANTITATION FETAL HEMOGLOBIN: 0 mL

## 2015-02-09 MED ORDER — LACTATED RINGERS IV BOLUS (SEPSIS): 1000.0000 mL | Freq: Once | INTRAVENOUS | Status: DC

## 2015-02-09 MED ORDER — TERBUTALINE SULFATE 1 MG/ML IJ SOLN
0.2500 mg | INTRAMUSCULAR | Status: AC | PRN
  Administered 2015-02-09 (×2): 0.25 mg via SUBCUTANEOUS
  Filled 2015-02-09 (×2): qty 1

## 2015-02-09 MED ORDER — MICONAZOLE NITRATE 2 % VA CREA: 1.0000 | TOPICAL_CREAM | Freq: Every day | VAGINAL | Status: DC

## 2015-02-09 MED ORDER — LACTATED RINGERS IV BOLUS (SEPSIS)
500.0000 mL | Freq: Once | INTRAVENOUS | Status: AC
  Administered 2015-02-09: 500 mL via INTRAVENOUS

## 2015-02-09 NOTE — Progress Notes (Signed)
Patient reports itching and burning vaginally and asked to be tested while in hospital; wet prep and GC/cly collected and in process

## 2015-02-09 NOTE — Discharge Instructions (Signed)
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
## 2015-02-09 NOTE — ED Notes (Signed)
OB RR at bedside 

## 2015-02-09 NOTE — Progress Notes (Addendum)
No contractions noted for last 2 hours; patient is cleared obstetrically at this time; patient cleared to be discharged home at ED staff's discretion

## 2015-02-10 LAB — GC/CHLAMYDIA PROBE AMP (~~LOC~~) NOT AT ARMC
Chlamydia: NEGATIVE
Neisseria Gonorrhea: NEGATIVE

## 2015-02-12 ENCOUNTER — Encounter (HOSPITAL_COMMUNITY): Payer: Self-pay | Admitting: *Deleted

## 2015-02-12 ENCOUNTER — Inpatient Hospital Stay (HOSPITAL_COMMUNITY)
Admission: AD | Admit: 2015-02-12 | Discharge: 2015-02-12 | Disposition: A | Discharge status: Home / Self Care | Payer: Medicaid Other | Source: Ambulatory Visit | Attending: Obstetrics | Admitting: Obstetrics

## 2015-02-12 DIAGNOSIS — O26893 Other specified pregnancy related conditions, third trimester: Secondary | ICD-10-CM | POA: Diagnosis present

## 2015-02-12 DIAGNOSIS — N949 Unspecified condition associated with female genital organs and menstrual cycle: Secondary | ICD-10-CM

## 2015-02-12 DIAGNOSIS — O99333 Smoking (tobacco) complicating pregnancy, third trimester: Secondary | ICD-10-CM | POA: Insufficient documentation

## 2015-02-12 DIAGNOSIS — R102 Pelvic and perineal pain: Secondary | ICD-10-CM | POA: Insufficient documentation

## 2015-02-12 DIAGNOSIS — O9989 Other specified diseases and conditions complicating pregnancy, childbirth and the puerperium: Secondary | ICD-10-CM | POA: Diagnosis not present

## 2015-02-12 DIAGNOSIS — F17210 Nicotine dependence, cigarettes, uncomplicated: Secondary | ICD-10-CM | POA: Diagnosis not present

## 2015-02-12 DIAGNOSIS — Z3A28 28 weeks gestation of pregnancy: Secondary | ICD-10-CM | POA: Diagnosis not present

## 2015-02-12 LAB — URINE MICROSCOPIC-ADD ON

## 2015-02-12 LAB — URINALYSIS, ROUTINE W REFLEX MICROSCOPIC
Bilirubin Urine: NEGATIVE
Glucose, UA: NEGATIVE mg/dL
Hgb urine dipstick: NEGATIVE
Ketones, ur: NEGATIVE mg/dL
Nitrite: NEGATIVE
PROTEIN: NEGATIVE mg/dL
Specific Gravity, Urine: 1.015 (ref 1.005–1.030)
pH: 5.5 (ref 5.0–8.0)

## 2015-02-12 NOTE — MAU Provider Note (Signed)
History     CSN: [id number]  Arrival date and time: 02/12/15 2007   First Provider Initiated Contact with Patient 02/12/15 2103      Chief Complaint  Patient presents with  . Contractions   Abdominal Pain This is a new problem. The current episode started today. The onset quality is gradual. The problem occurs intermittently. The problem has been unchanged. The pain is located in the generalized abdominal region. The pain is at a severity of 6/10. The quality of the pain is cramping. The abdominal pain does not radiate. Pertinent negatives include no constipation, diarrhea, dysuria, fever, frequency, nausea or vomiting. Nothing aggravates the pain. The pain is relieved by nothing. She has tried nothing for the symptoms.    Past Medical History  Diagnosis Date  . Urinary tract infection   . Abnormal Pap smear     colpo- wnl  . Vaginal Pap smear, abnormal     Past Surgical History  Procedure Laterality Date  . No past surgeries      Family History  Problem Relation Age of Onset  . Anesthesia problems Neg Hx   . Hypertension Father   . Hypertension Maternal Grandmother   . Cancer Maternal Grandfather   . Diabetes Paternal Grandmother     Social History  Substance Use Topics  . Smoking status: Current Every Day Smoker -- 0.25 packs/day for 6 years    Types: Cigarettes    Last Attempt to Quit: 04/26/2011  . Smokeless tobacco: Never Used  . Alcohol Use: No    Allergies: No Known Allergies  Prescriptions prior to admission  Medication Sig Dispense Refill Last Dose  . [START ON 02/15/2015] miconazole (MICONAZOLE 7) 2 % vaginal cream Place 1 Applicatorful vaginally at bedtime. 45 g 0 02/11/2015 at Unknown time  . acetaminophen (TYLENOL) 500 MG tablet Take 1,000 mg by mouth every 6 (six) hours as needed for pain. Reported on 02/12/2015   Not Taking at Unknown time    Review of Systems  Constitutional: Negative for fever and chills.  Gastrointestinal: Positive for abdominal  pain. Negative for nausea, vomiting, diarrhea and constipation.  Genitourinary: Negative for dysuria, urgency and frequency.   Physical Exam   Blood pressure 122/70, pulse 92, temperature 97.9 F (36.6 C), temperature source Oral, resp. rate 18, SpO2 100 %.  Physical Exam  Nursing note and vitals reviewed. Constitutional: She is oriented to person, place, and time. She appears well-developed and well-nourished. No distress.  HENT:  Head: Normocephalic.  Cardiovascular: Normal rate.   Respiratory: Effort normal.  GI: Soft. There is no tenderness.  Genitourinary:  Cervix: FT at ext os/closed at int os/thick/-3   Neurological: She is alert and oriented to person, place, and time.  Skin: Skin is warm.  Psychiatric: She has a normal mood and affect.    FHT 135, moderate with 15x15 accels, no decels Toco: no UCs  MAU Course  Procedures  MDM   Assessment and Plan   1. Round ligament pain   2. [redacted] weeks gestation of pregnancy    DC home Comfort measures reviewed  3rd Trimester precautions  PTL precautions  Fetal kick counts RX: none  Return to MAU as needed FU with OB as planned  Follow-up Information    Schedule an appointment as soon as possible for a visit with Kathreen Cosier, MD.   Specialty:  Obstetrics and Gynecology   Contact information:   [address] [place] [address] [phone]  Tawnya Crook 02/12/2015, 9:06 PM

## 2015-02-12 NOTE — Discharge Instructions (Signed)
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
## 2015-04-16 ENCOUNTER — Encounter (HOSPITAL_COMMUNITY): Payer: Self-pay | Admitting: *Deleted

## 2015-04-16 ENCOUNTER — Inpatient Hospital Stay (HOSPITAL_COMMUNITY)
Admission: AD | Admit: 2015-04-16 | Discharge: 2015-04-16 | Disposition: A | Discharge status: Home / Self Care | Payer: Medicaid Other | Source: Ambulatory Visit | Attending: Obstetrics | Admitting: Obstetrics

## 2015-04-16 DIAGNOSIS — O99333 Smoking (tobacco) complicating pregnancy, third trimester: Secondary | ICD-10-CM | POA: Diagnosis not present

## 2015-04-16 DIAGNOSIS — Z3A37 37 weeks gestation of pregnancy: Secondary | ICD-10-CM | POA: Diagnosis not present

## 2015-04-16 DIAGNOSIS — F17210 Nicotine dependence, cigarettes, uncomplicated: Secondary | ICD-10-CM | POA: Insufficient documentation

## 2015-04-16 DIAGNOSIS — O26893 Other specified pregnancy related conditions, third trimester: Secondary | ICD-10-CM

## 2015-04-16 DIAGNOSIS — N898 Other specified noninflammatory disorders of vagina: Secondary | ICD-10-CM | POA: Diagnosis not present

## 2015-04-16 HISTORY — DX: Herpesviral infection, unspecified: B00.9

## 2015-04-16 NOTE — MAU Note (Signed)
Pt states she thinks her water broke with clear fluid this morning about 0800-0900.  Pt states it soaked her panties and needed to change clothing.  Continues to have trickling.  Denies vaginal bleeding.  Contractions about every 5 minutes.  Good fetal movement.

## 2015-04-16 NOTE — Discharge Instructions (Signed)
Braxton Hicks Contractions °Contractions of the uterus can occur throughout pregnancy. Contractions are not always a sign that you are in labor.  °WHAT ARE BRAXTON HICKS CONTRACTIONS?  °Contractions that occur before labor are called Braxton Hicks contractions, or false labor. Toward the end of pregnancy (32-34 weeks), these contractions can develop more often and may become more forceful. This is not true labor because these contractions do not result in opening (dilatation) and thinning of the cervix. They are sometimes difficult to tell apart from true labor because these contractions can be forceful and people have different pain tolerances. You should not feel embarrassed if you go to the hospital with false labor. Sometimes, the only way to tell if you are in true labor is for your health care provider to look for changes in the cervix. °If there are no prenatal problems or other health problems associated with the pregnancy, it is completely safe to be sent home with false labor and await the onset of true labor. °HOW CAN YOU TELL THE DIFFERENCE BETWEEN TRUE AND FALSE LABOR? °False Labor °· The contractions of false labor are usually shorter and not as hard as those of true labor.   °· The contractions are usually irregular.   °· The contractions are often felt in the front of the lower abdomen and in the groin.   °· The contractions may go away when you walk around or change positions while lying down.   °· The contractions get weaker and are shorter lasting as time goes on.   °· The contractions do not usually become progressively stronger, regular, and closer together as with true labor.   °True Labor °· Contractions in true labor last 30-70 seconds, become very regular, usually become more intense, and increase in frequency.   °· The contractions do not go away with walking.   °· The discomfort is usually felt in the top of the uterus and spreads to the lower abdomen and low back.   °· True labor can be  determined by your health care provider with an exam. This will show that the cervix is dilating and getting thinner.   °WHAT TO REMEMBER °· Keep up with your usual exercises and follow other instructions given by your health care provider.   °· Take medicines as directed by your health care provider.   °· Keep your regular prenatal appointments.   °· Eat and drink lightly if you think you are going into labor.   °· If Braxton Hicks contractions are making you uncomfortable:   °¨ Change your position from lying down or resting to walking, or from walking to resting.   °¨ Sit and rest in a tub of warm water.   °¨ Drink 2-3 glasses of water. Dehydration may cause these contractions.   °¨ Do slow and deep breathing several times an hour.   °WHEN SHOULD I SEEK IMMEDIATE MEDICAL CARE? °Seek immediate medical care if: °· Your contractions become stronger, more regular, and closer together.   °· You have fluid leaking or gushing from your vagina.   °· You have a fever.   °· You pass blood-tinged mucus.   °· You have vaginal bleeding.   °· You have continuous abdominal pain.   °· You have low back pain that you never had before.   °· You feel your baby's head pushing down and causing pelvic pressure.   °· Your baby is not moving as much as it used to.   °  °This information is not intended to replace advice given to you by your health care provider. Make sure you discuss any questions you have with your health care   provider. °  °Document Released: 12/20/2004 Document Revised: 12/25/2012 Document Reviewed: 10/01/2012 °Elsevier Interactive Patient Education ©2016 [employer] ° °

## 2015-04-16 NOTE — MAU Provider Note (Signed)
  History     CSN: [id number]  Arrival date and time: 04/16/15 1052   First Provider Initiated Contact with Patient 04/16/15 1157      Chief Complaint  Patient presents with  . Labor Eval  . Rupture of Membranes   HPI Meagan Burgess 25 y.o. [id number] @ [id number] presents to MAU stating that she had some leaking earlier today. Denies vaginal bleeding.   Past Medical History  Diagnosis Date  . Urinary tract infection   . Abnormal Pap smear     colpo- wnl  . Vaginal Pap smear, abnormal   . Herpes     Past Surgical History  Procedure Laterality Date  . No past surgeries      Family History  Problem Relation Age of Onset  . Anesthesia problems Neg Hx   . Hypertension Father   . Hypertension Maternal Grandmother   . Cancer Maternal Grandfather   . Diabetes Paternal Grandmother     Social History  Substance Use Topics  . Smoking status: Current Every Day Smoker -- 1.00 packs/day for 6 years    Types: Cigarettes    Last Attempt to Quit: 04/26/2011  . Smokeless tobacco: Never Used  . Alcohol Use: No    Allergies: No Known Allergies  Prescriptions prior to admission  Medication Sig Dispense Refill Last Dose  . calcium carbonate (TUMS - DOSED IN MG ELEMENTAL CALCIUM) 500 MG chewable tablet Chew 2 tablets by mouth 3 (three) times daily as needed for indigestion or heartburn.   04/14/2015  . valACYclovir (VALTREX) 500 MG tablet Take 500 mg by mouth daily.   04/15/2015 at Unknown time  . miconazole (MICONAZOLE 7) 2 % vaginal cream Place 1 Applicatorful vaginally at bedtime. (Patient not taking: Reported on 04/16/2015) 45 g 0 02/11/2015 at Unknown time    Review of Systems  Gastrointestinal: Positive for abdominal pain.  Genitourinary:       Vaginal leaking   Physical Exam   Blood pressure 125/82, pulse 90, temperature 98.3 F (36.8 C), temperature source Oral, resp. rate 18, SpO2 99 %.  Physical Exam  Nursing note and vitals reviewed. Constitutional: She is oriented  to person, place, and time. She appears well-developed and well-nourished.  HENT:  Head: Normocephalic and atraumatic.  Neck: Normal range of motion.  Cardiovascular: Normal rate.   Respiratory: Effort normal. No respiratory distress.  GI: Soft. There is no tenderness.  Genitourinary: Vagina normal.  No pooling  Musculoskeletal: Normal range of motion.  Neurological: She is alert and oriented to person, place, and time.  Skin: Skin is warm and dry.  Psychiatric: She has a normal mood and affect. Her behavior is normal. Judgment and thought content normal.   Dilation: 1.5 Effacement (%): Thick Cervical Position: Posterior Station: -2 Presentation: Vertex Exam by:: L. Paschal, RN   MAU Course  Procedures  MDM FHR reactive; Not in labor, No pooling on spec exam. Fern test negative  Assessment and Plan  Vaginal discharge  Discharge  [name],[name] 04/16/2015, 12:33 PM

## 2015-04-16 NOTE — MAU Provider Note (Signed)
History     CSN: [id number]  Arrival date and time: 04/16/15 1052   First Provider Initiated Contact with Patient 04/16/15 1157      Chief Complaint  Patient presents with  . Labor Eval  . Rupture of Membranes   HPI  Meagan Burgess is a 25 yo G3P2002 at 37 weeks 3 days gestation presenting with vaginal fluid leakage. Patient felt wetness at 8 this morning and said that her underwear was soaked through with a clear fluid. She denies discharge or bleeding. Patient felt like she was having contractions about every 5-10 minutes. She denies abdominal pain other than suprapubic pressure she's had for a week. Reports no other symptoms. Reports good fetal movement.   OB History    Gravida Para Term Preterm AB TAB SAB Ectopic Multiple Living   0 0 0 0 0 0 2      Past Medical History  Diagnosis Date  . Urinary tract infection   . Abnormal Pap smear     colpo- wnl  . Vaginal Pap smear, abnormal   . Herpes     Past Surgical History  Procedure Laterality Date  . No past surgeries      Family History  Problem Relation Age of Onset  . Anesthesia problems Neg Hx   . Hypertension Father   . Hypertension Maternal Grandmother   . Cancer Maternal Grandfather   . Diabetes Paternal Grandmother     Social History  Substance Use Topics  . Smoking status: Current Every Day Smoker -- 1.00 packs/day for 6 years    Types: Cigarettes    Last Attempt to Quit: 04/26/2011  . Smokeless tobacco: Never Used  . Alcohol Use: No    Allergies: No Known Allergies  Prescriptions prior to admission  Medication Sig Dispense Refill Last Dose  . calcium carbonate (TUMS - DOSED IN MG ELEMENTAL CALCIUM) 500 MG chewable tablet Chew 2 tablets by mouth 3 (three) times daily as needed for indigestion or heartburn.   04/14/2015  . valACYclovir (VALTREX) 500 MG tablet Take 500 mg by mouth daily.   04/15/2015 at Unknown time  . miconazole (MICONAZOLE 7) 2 % vaginal cream Place 1 Applicatorful vaginally at  bedtime. (Patient not taking: Reported on 04/16/2015) 45 g 0 02/11/2015 at Unknown time    ROS  Positive per HPI Physical Exam   Blood pressure 117/73, pulse 68, temperature 98.3 F (36.8 C), temperature source Oral, resp. rate 18, SpO2 99 %.  Physical Exam  Constitutional: She is oriented to person, place, and time. She appears well-developed and well-nourished. No distress.  HENT:  Head: Normocephalic and atraumatic.  Neck: Normal range of motion.  Cardiovascular: Normal rate, regular rhythm and intact distal pulses.   No murmur heard. Respiratory: Effort normal and breath sounds normal.  GI: Soft. She exhibits no distension. There is no tenderness.  Genitourinary: Uterus normal.  Normal-appearing cervix with creamy white discharge and no blood. No pooling of fluid. Nurse estimated cervix to be 1-2 cm dilated.   Musculoskeletal: Normal range of motion.  Neurological: She is alert and oriented to person, place, and time.  Skin: Skin is warm and dry. No rash noted.  Psychiatric: She has a normal mood and affect.    MAU Course  Procedures: None FHT monitored, normal  Blind fern test negative    Assessment and Plan  Meagan Burgess is a [id number] at [redacted]w[redacted]d presenting for a labor evaluation and possible rupture of membranes. Negative fern test and no  pooling visualized on pelvic exam is reassuring that membranes are intact. Good fetal heart tones and movement as well as lack of cervical dilation are also reassuring.  Plan: Discharge home with instructions to seek care immediately if signs of labor recur (contractions, fluid)  Meagan Burgess 04/16/2015, 12:34 PM

## 2015-04-19 ENCOUNTER — Inpatient Hospital Stay (HOSPITAL_COMMUNITY)
Admission: AD | Admit: 2015-04-19 | Discharge: 2015-04-19 | Disposition: A | Discharge status: Home / Self Care | Payer: Medicaid Other | Source: Ambulatory Visit | Attending: Obstetrics | Admitting: Obstetrics

## 2015-04-19 ENCOUNTER — Encounter (HOSPITAL_COMMUNITY): Payer: Self-pay | Admitting: *Deleted

## 2015-04-19 DIAGNOSIS — Z3493 Encounter for supervision of normal pregnancy, unspecified, third trimester: Secondary | ICD-10-CM | POA: Diagnosis not present

## 2015-04-19 NOTE — Progress Notes (Signed)
Dr Clearance CootsHarper notified of pt's admission and status. Aware of ctx pattern, sve, FHR reactive. Pt stable for dc

## 2015-04-19 NOTE — MAU Note (Signed)
Contractions all night. Denies LOF or bleeding. Has to be at work at 0600 and wanted to be checked first. Rash on arms and back which says she has and is not new but has been itching tonight

## 2015-04-19 NOTE — Progress Notes (Signed)
Pt had to leave and go to work before receiving written d/c instructions. Gave pt verbal instructions for labor precautions. Has appt with Dr Gaynell FaceMarshall on Monday

## 2015-04-30 ENCOUNTER — Encounter (HOSPITAL_COMMUNITY): Payer: Self-pay | Admitting: *Deleted

## 2015-04-30 ENCOUNTER — Telehealth (HOSPITAL_COMMUNITY): Payer: Self-pay | Admitting: *Deleted

## 2015-04-30 ENCOUNTER — Other Ambulatory Visit: Payer: Self-pay | Admitting: Obstetrics

## 2015-04-30 LAB — OB RESULTS CONSOLE GBS: GBS: NEGATIVE

## 2015-04-30 NOTE — Telephone Encounter (Signed)
Preadmission screen  

## 2015-05-02 ENCOUNTER — Encounter (HOSPITAL_COMMUNITY): Payer: Self-pay | Admitting: *Deleted

## 2015-05-02 ENCOUNTER — Inpatient Hospital Stay (EMERGENCY_DEPARTMENT_HOSPITAL)
Admission: AD | Admit: 2015-05-02 | Discharge: 2015-05-03 | Disposition: A | Discharge status: Home / Self Care | Payer: Medicaid Other | Source: Ambulatory Visit | Attending: Obstetrics | Admitting: Obstetrics

## 2015-05-02 DIAGNOSIS — R519 Headache, unspecified: Secondary | ICD-10-CM

## 2015-05-02 DIAGNOSIS — O23593 Infection of other part of genital tract in pregnancy, third trimester: Secondary | ICD-10-CM

## 2015-05-02 DIAGNOSIS — R51 Headache: Principal | ICD-10-CM

## 2015-05-02 DIAGNOSIS — A5901 Trichomonal vulvovaginitis: Secondary | ICD-10-CM

## 2015-05-02 DIAGNOSIS — O26893 Other specified pregnancy related conditions, third trimester: Secondary | ICD-10-CM

## 2015-05-02 NOTE — MAU Note (Signed)
Pt stated she started noticing her right side of her face was swelling and she had a bad headache on THursday. Headache is better after tylenol but still has swelling in face hands and feet. Called Doctor on call and was told to come in.

## 2015-05-03 ENCOUNTER — Encounter (HOSPITAL_COMMUNITY): Payer: Self-pay | Admitting: Advanced Practice Midwife

## 2015-05-03 ENCOUNTER — Inpatient Hospital Stay (HOSPITAL_COMMUNITY)
Admission: RE | Admit: 2015-05-03 | Discharge: 2015-05-06 | DRG: 774 | Disposition: A | Discharge status: Home / Self Care | Payer: Medicaid Other | Source: Ambulatory Visit | Attending: Obstetrics | Admitting: Obstetrics

## 2015-05-03 DIAGNOSIS — A6000 Herpesviral infection of urogenital system, unspecified: Secondary | ICD-10-CM | POA: Diagnosis present

## 2015-05-03 DIAGNOSIS — Z3A40 40 weeks gestation of pregnancy: Secondary | ICD-10-CM

## 2015-05-03 DIAGNOSIS — O98313 Other infections with a predominantly sexual mode of transmission complicating pregnancy, third trimester: Secondary | ICD-10-CM | POA: Diagnosis not present

## 2015-05-03 DIAGNOSIS — O99334 Smoking (tobacco) complicating childbirth: Secondary | ICD-10-CM | POA: Diagnosis present

## 2015-05-03 DIAGNOSIS — A5901 Trichomonal vulvovaginitis: Secondary | ICD-10-CM | POA: Diagnosis present

## 2015-05-03 DIAGNOSIS — O9989 Other specified diseases and conditions complicating pregnancy, childbirth and the puerperium: Secondary | ICD-10-CM | POA: Diagnosis not present

## 2015-05-03 DIAGNOSIS — O9832 Other infections with a predominantly sexual mode of transmission complicating childbirth: Secondary | ICD-10-CM | POA: Diagnosis present

## 2015-05-03 DIAGNOSIS — R51 Headache: Secondary | ICD-10-CM

## 2015-05-03 DIAGNOSIS — F17210 Nicotine dependence, cigarettes, uncomplicated: Secondary | ICD-10-CM | POA: Diagnosis present

## 2015-05-03 DIAGNOSIS — O4202 Full-term premature rupture of membranes, onset of labor within 24 hours of rupture: Secondary | ICD-10-CM | POA: Diagnosis present

## 2015-05-03 DIAGNOSIS — Z3490 Encounter for supervision of normal pregnancy, unspecified, unspecified trimester: Secondary | ICD-10-CM

## 2015-05-03 LAB — URINALYSIS, ROUTINE W REFLEX MICROSCOPIC
Bilirubin Urine: NEGATIVE
Glucose, UA: NEGATIVE mg/dL
Hgb urine dipstick: NEGATIVE
KETONES UR: NEGATIVE mg/dL
NITRITE: POSITIVE — AB
PH: 6.5 (ref 5.0–8.0)
Protein, ur: NEGATIVE mg/dL
Specific Gravity, Urine: 1.015 (ref 1.005–1.030)

## 2015-05-03 LAB — PROTEIN / CREATININE RATIO, URINE
Creatinine, Urine: 155 mg/dL
Protein Creatinine Ratio: 0.1 mg/mg{Cre} (ref 0.00–0.15)
TOTAL PROTEIN, URINE: 15 mg/dL

## 2015-05-03 LAB — COMPREHENSIVE METABOLIC PANEL
ALBUMIN: 2.5 g/dL — AB (ref 3.5–5.0)
ALK PHOS: 217 U/L — AB (ref 38–126)
ALT: 13 U/L — AB (ref 14–54)
AST: 16 U/L (ref 15–41)
Anion gap: 6 (ref 5–15)
BUN: 9 mg/dL (ref 6–20)
CALCIUM: 8.9 mg/dL (ref 8.9–10.3)
CHLORIDE: 108 mmol/L (ref 101–111)
CO2: 23 mmol/L (ref 22–32)
CREATININE: 0.79 mg/dL (ref 0.44–1.00)
GFR calc Af Amer: >60 mL/min (ref >60)
GFR calc non Af Amer: >60 mL/min (ref >60)
GLUCOSE: 104 mg/dL — AB (ref 65–99)
Potassium: 4 mmol/L (ref 3.5–5.1)
SODIUM: 137 mmol/L (ref 135–145)
Total Bilirubin: 0.5 mg/dL (ref 0.3–1.2)
Total Protein: 5.8 g/dL — ABNORMAL LOW (ref 6.5–8.1)

## 2015-05-03 LAB — URINE MICROSCOPIC-ADD ON: RBC / HPF: NONE SEEN RBC/hpf (ref 0–5)

## 2015-05-03 LAB — CBC
HCT: 30 % — ABNORMAL LOW (ref 36.0–46.0)
HEMOGLOBIN: 9.7 g/dL — AB (ref 12.0–15.0)
MCH: 27.6 pg (ref 26.0–34.0)
MCHC: 32.3 g/dL (ref 30.0–36.0)
MCV: 85.2 fL (ref 78.0–100.0)
PLATELETS: 232 10*3/uL (ref 150–400)
RBC: 3.52 MIL/uL — AB (ref 3.87–5.11)
RDW: 13.6 % (ref 11.5–15.5)
WBC: 10.4 10*3/uL (ref 4.0–10.5)

## 2015-05-03 MED ORDER — METRONIDAZOLE 500 MG PO TABS
2000.0000 mg | ORAL_TABLET | Freq: Once | ORAL | Status: AC
  Administered 2015-05-03: 2000 mg via ORAL
  Filled 2015-05-03: qty 4

## 2015-05-03 MED ORDER — ACETAMINOPHEN 500 MG PO TABS: 500.0000 mg | ORAL_TABLET | Freq: Four times a day (QID) | ORAL | Status: DC | PRN

## 2015-05-03 NOTE — MAU Provider Note (Signed)
Chief Complaint:  Facial Swelling   First Provider Initiated Contact with Patient 05/03/15 0000     HPI: Meagan Burgess is a 25 y.o. G3P2002 at 8w6dwho presents to maternity admissions reporting Swelling in hands and face today. Severe HA yesterday that resolved w/ Tylenol. Called after hours number and was told to come to maternity admissions for evaluation.  Associated signs and symptoms: Negative for vision changes, epigastric pain.  Mild contractions. Denies leakage of fluid or vaginal bleeding. Good fetal movement. Has induction of labor scheduled for tomorrow night.  Past Medical History: Past Medical History  Diagnosis Date  . Urinary tract infection   . Abnormal Pap smear     colpo- wnl  . Vaginal Pap smear, abnormal   . Herpes     Past obstetric history: OB History  Gravida Para Term Preterm AB SAB TAB Ectopic Multiple Living  '3 2 2 '$ 0 0 0 0 0 0 2    # Outcome Date GA Lbr Len/2nd Weight Sex Delivery Anes PTL Lv  3 Current           2 Term 11/15/11 [id number] / 00:06 6 lb 4 oz (2.835 kg) F Vag-Spont EPI  Y  1 Term 2011 4057w0d lb 5 oz (3.317 kg) M Vag-Spont   Y      Past Surgical History: Past Surgical History  Procedure Laterality Date  . No past surgeries       Family History: Family History  Problem Relation Age of Onset  . Anesthesia problems Neg Hx   . Hypertension Father   . Hypertension Maternal Grandmother   . Cancer Maternal Grandfather   . Diabetes Paternal Grandmother     Social History: Social History  Substance Use Topics  . Smoking status: Current Every Day Smoker -- 1.00 packs/day for 6 years    Types: Cigarettes    Last Attempt to Quit: 04/26/2011  . Smokeless tobacco: Never Used  . Alcohol Use: No    Allergies: No Known Allergies  Meds:  Prescriptions prior to admission  Medication Sig Dispense Refill Last Dose  . calcium carbonate (TUMS - DOSED IN MG ELEMENTAL CALCIUM) 500 MG chewable tablet Chew 2 tablets by mouth 3  (three) times daily as needed for indigestion or heartburn.   Past Week at Unknown time  . miconazole (MICONAZOLE 7) 2 % vaginal cream Place 1 Applicatorful vaginally at bedtime. (Patient not taking: Reported on 04/16/2015) 45 g 0 02/11/2015 at Unknown time  . valACYclovir (VALTREX) 500 MG tablet Take 500 mg by mouth daily.   04/18/2015 at Unknown time    I have reviewed patient's Past Medical Hx, Surgical Hx, Family Hx, Social Hx, medications and allergies.   ROS:  Review of Systems  Constitutional: Negative for fever and chills.  HENT: Positive for facial swelling. Negative for dental problem and sinus pressure.   Eyes: Negative for photophobia and visual disturbance.  Respiratory: Negative for shortness of breath.   Gastrointestinal: Negative for abdominal pain.  Genitourinary: Negative for vaginal bleeding.  Neurological: Positive for headaches (Resolved). Negative for weakness.    Physical Exam  Patient Vitals for the past 24 hrs:  BP Temp Temp src Pulse Resp Height  05/02/15 2247 127/71 mmHg 98.7 F (37.1 C) Axillary 88 18 '5\' 1"'$  (1.549 m)   Constitutional: Well-developed, well-nourished female in no acute distress.  Head: No Visible swelling. Grossly normal vision.  Cardiovascular: normal rate Respiratory: normal effort GI: Abd soft, non-tender, gravid appropriate for  gestational age.  MS: Extremities nontender, no edema, normal ROM Neurologic: Alert and oriented x 4.  GU: Deferred  FHT:  Baseline 125 , moderate variability, accelerations present, no decelerations Contractions: Rare, mild   Labs: Results for MADISAN, BICE (MRN [id number]) as of 05/09/2015 20:58  Ref. Range 05/02/2015 23:50 05/03/2015 00:19  Sodium Latest Ref Range: 135-145 mmol/L  137  Potassium Latest Ref Range: 3.5-5.1 mmol/L  4.0  Chloride Latest Ref Range: 101-111 mmol/L  108  CO2 Latest Ref Range: 22-32 mmol/L  23  BUN Latest Ref Range: 6-20 mg/dL  9  Creatinine Latest Ref Range: 0.44-1.00 mg/dL   0.79  Calcium Latest Ref Range: 8.9-10.3 mg/dL  8.9  EGFR (Non-African Amer.) Latest Ref Range: >60 mL/min  >60  EGFR (African American) Latest Ref Range: >60 mL/min  >60  Glucose Latest Ref Range: 65-99 mg/dL  104 (H)  Anion gap Latest Ref Range: 5-15   6  Alkaline Phosphatase Latest Ref Range: 38-126 U/L  217 (H)  Albumin Latest Ref Range: 3.5-5.0 g/dL  2.5 (L)  AST Latest Ref Range: 15-41 U/L  16  ALT Latest Ref Range: 14-54 U/L  13 (L)  Total Protein Latest Ref Range: 6.5-8.1 g/dL  5.8 (L)  Total Bilirubin Latest Ref Range: 0.3-1.2 mg/dL  0.5  WBC Latest Ref Range: 4.0-10.5 K/uL  10.4  RBC Latest Ref Range: 3.87-5.11 MIL/uL  3.52 (L)  Hemoglobin Latest Ref Range: 12.0-15.0 g/dL  9.7 (L)  HCT Latest Ref Range: 36.0-46.0 %  30.0 (L)  MCV Latest Ref Range: 78.0-100.0 fL  85.2  MCH Latest Ref Range: 26.0-34.0 pg  27.6  MCHC Latest Ref Range: 30.0-36.0 g/dL  32.3  RDW Latest Ref Range: 11.5-15.5 %  13.6  Platelets Latest Ref Range: 150-400 K/uL  232  Appearance Latest Ref Range: CLEAR  CLOUDY (A)   Bacteria, UA Latest Ref Range: NONE SEEN  MANY (A)   Bilirubin Urine Latest Ref Range: NEGATIVE  NEGATIVE   Color, Urine Latest Ref Range: YELLOW  YELLOW   Glucose Latest Ref Range: NEGATIVE mg/dL NEGATIVE   Hgb urine dipstick Latest Ref Range: NEGATIVE  NEGATIVE   Ketones, ur Latest Ref Range: NEGATIVE mg/dL NEGATIVE   Leukocytes, UA Latest Ref Range: NEGATIVE  SMALL (A)   Nitrite Latest Ref Range: NEGATIVE  POSITIVE (A)   pH Latest Ref Range: 5.0-8.0  6.5   Protein Latest Ref Range: NEGATIVE mg/dL NEGATIVE   RBC / HPF Latest Ref Range: 0-5 RBC/hpf NONE SEEN   Specific Gravity, Urine Latest Ref Range: 1.005-1.030  1.015   Squamous Epithelial / LPF Latest Ref Range: NONE SEEN  0-5 (A)   Urine-Other Unknown MUCOUS PRESENT   WBC, UA Latest Ref Range: 0-5 WBC/hpf 6-30   Total Protein, Urine Latest Units: mg/dL 15   Protein Creatinine Ratio Latest Ref Range: 0.00-0.15 mg/mgCre 0.10    Creatinine, Urine Latest Units: mg/dL 155.00     Imaging:  NA  MAU Course: BP normal. Discussed Hx, exam w/ Dr. Jodi Mourning. Will draw Pre-E labs top check for atypical presentation of Pre-E.   UA pos Trich. Flagyl given. Offered EPT. Pt states partner in incarcerated. Hasn't had IC in a long time". Recommend informing him so that he can be Tx'd.   MDM: - Acute headache of unknown etiology. Low suspision for emergent intracranial process due to near-complete resolution w/ Tylenol, absence of neuro Sx or fever.  - Trichomonas. Treated with Flagyl in MAU.    Assessment: 1. Headache in pregnancy,  antepartum, third trimester   2. Trichomonal vaginitis during pregnancy in third trimester     Plan: Discharge home in stable condition per consult with Dr. Jodi Mourning.  Labor precautions and fetal kick counts  headache red flags reviewed Tylenol when necessary. Preeclampsia precautions. Follow-up Information    Follow up with [facility] On 05/04/2015.   Why:  12:00 am for induction of labor   Contact information:   [address] [id number] [place] [phone]      Follow up with [facility].   Why:  As needed if symptoms worsen   Contact information:   [address] [id number] [place] [phone]      Follow up with [facility].   Specialty:  Emergency Medicine   Why:  For severe headache that does not improve with Tylenol, diffuculties with speaking or walking, weakness.   Contact information:   [address] [id number] [place] [phone]        Medication List    STOP taking these medications        calcium carbonate 500 MG chewable tablet  Commonly known as:  TUMS - dosed in mg elemental calcium     miconazole 2 % vaginal cream  Commonly  known as:  MICONAZOLE 7     valACYclovir 500 MG tablet  Commonly known as:  Lamar Blinks, CNM 05/03/2015 12:00 AM

## 2015-05-03 NOTE — Discharge Instructions (Signed)
General Headache Without Cause A headache is pain or discomfort felt around the head or neck area. The specific cause of a headache may not be found. There are many causes and types of headaches. A few common ones are:  Tension headaches.  Migraine headaches.  Cluster headaches.  Chronic daily headaches. HOME CARE INSTRUCTIONS  Watch your condition for any changes. Take these steps to help with your condition: Managing Pain  Take over-the-counter and prescription medicines only as told by your health care provider.  Lie down in a dark, quiet room when you have a headache.  If directed, apply ice to the head and neck area:  Put ice in a plastic bag.  Place a towel between your skin and the bag.  Leave the ice on for 20 minutes, 2-3 times per day.  Use a heating pad or hot shower to apply heat to the head and neck area as told by your health care provider.  Keep lights dim if bright lights bother you or make your headaches worse. Eating and Drinking  Eat meals on a regular schedule.  Limit alcohol use.  Decrease the amount of caffeine you drink, or stop drinking caffeine. General Instructions  Keep all follow-up visits as told by your health care provider. This is important.  Keep a headache journal to help find out what may trigger your headaches. For example, write down:  What you eat and drink.  How much sleep you get.  Any change to your diet or medicines.  Try massage or other relaxation techniques.  Limit stress.  Sit up straight, and do not tense your muscles.  Do not use tobacco products, including cigarettes, chewing tobacco, or e-cigarettes. If you need help quitting, ask your health care provider.  Exercise regularly as told by your health care provider.  Sleep on a regular schedule. Get 7-9 hours of sleep, or the amount recommended by your health care provider. SEEK MEDICAL CARE IF:   Your symptoms are not helped by medicine.  You have a  headache that is different from the usual headache.  You have nausea or you vomit.  You have a fever. SEEK IMMEDIATE MEDICAL CARE IF:   Your headache becomes severe.  You have repeated vomiting.  You have a stiff neck.  You have a loss of vision.  You have problems with speech.  You have pain in the eye or ear.  You have muscular weakness or loss of muscle control.  You lose your balance or have trouble walking.  You feel faint or pass out.  You have confusion.   This information is not intended to replace advice given to you by your health care provider. Make sure you discuss any questions you have with your health care provider.   Document Released: 12/20/2004 Document Revised: 09/10/2014 Document Reviewed: 04/14/2014 Elsevier Interactive Patient Education 2016 [employer]  Trichomoniasis Trichomoniasis is an infection caused by an organism called Trichomonas. The infection can affect both women and men. In women, the outer female genitalia and the vagina are affected. In men, the penis is mainly affected, but the prostate and other reproductive organs can also be involved. Trichomoniasis is a sexually transmitted infection (STI) and is most often passed to another person through sexual contact.  RISK FACTORS  Having unprotected sexual intercourse.  Having sexual intercourse with an infected partner. SIGNS AND SYMPTOMS  Symptoms of trichomoniasis in women include:  Abnormal gray-green frothy vaginal discharge.  Itching and irritation of the vagina.  Itching  and irritation of the area outside the vagina. Symptoms of trichomoniasis in men include:   Penile discharge with or without pain.  Pain during urination. This results from inflammation of the urethra. DIAGNOSIS  Trichomoniasis may be found during a Pap test or physical exam. Your health care provider may use one of the following methods to help diagnose this infection:  Testing the pH of the vagina with  a test tape.  Using a vaginal swab test that checks for the Trichomonas organism. A test is available that provides results within a few minutes.  Examining a urine sample.  Testing vaginal secretions. Your health care provider may test you for other STIs, including HIV. TREATMENT   You may be given medicine to fight the infection. Women should inform their health care provider if they could be or are pregnant. Some medicines used to treat the infection should not be taken during pregnancy.  Your health care provider may recommend over-the-counter medicines or creams to decrease itching or irritation.  Your sexual partner will need to be treated if infected.  Your health care provider may test you for infection again 3 months after treatment. HOME CARE INSTRUCTIONS   Take medicines only as directed by your health care provider.  Take over-the-counter medicine for itching or irritation as directed by your health care provider.  Do not have sexual intercourse while you have the infection.  Women should not douche or wear tampons while they have the infection.  Discuss your infection with your partner. Your partner may have gotten the infection from you, or you may have gotten it from your partner.  Have your sex partner get examined and treated if necessary.  Practice safe, informed, and protected sex.  See your health care provider for other STI testing. SEEK MEDICAL CARE IF:   You still have symptoms after you finish your medicine.  You develop abdominal pain.  You have pain when you urinate.  You have bleeding after sexual intercourse.  You develop a rash.  Your medicine makes you sick or makes you throw up (vomit). MAKE SURE YOU:  Understand these instructions.  Will watch your condition.  Will get help right away if you are not doing well or get worse.   This information is not intended to replace advice given to you by your health care provider. Make sure  you discuss any questions you have with your health care provider.   Document Released: 06/15/2000 Document Revised: 01/10/2014 Document Reviewed: 10/01/2012 Elsevier Interactive Patient Education [employer]

## 2015-05-04 ENCOUNTER — Inpatient Hospital Stay (HOSPITAL_COMMUNITY): Admission: RE | Admit: 2015-05-04 | Payer: No Typology Code available for payment source | Source: Ambulatory Visit

## 2015-05-04 ENCOUNTER — Inpatient Hospital Stay (HOSPITAL_COMMUNITY): Payer: Medicaid Other | Admitting: Anesthesiology

## 2015-05-04 ENCOUNTER — Encounter (HOSPITAL_COMMUNITY): Payer: Self-pay | Admitting: Anesthesiology

## 2015-05-04 DIAGNOSIS — Z3490 Encounter for supervision of normal pregnancy, unspecified, unspecified trimester: Secondary | ICD-10-CM

## 2015-05-04 LAB — CBC
HCT: 31.6 % — ABNORMAL LOW (ref 36.0–46.0)
Hemoglobin: 10.2 g/dL — ABNORMAL LOW (ref 12.0–15.0)
MCH: 27.4 pg (ref 26.0–34.0)
MCHC: 32.3 g/dL (ref 30.0–36.0)
MCV: 84.9 fL (ref 78.0–100.0)
PLATELETS: 247 10*3/uL (ref 150–400)
RBC: 3.72 MIL/uL — AB (ref 3.87–5.11)
RDW: 13.6 % (ref 11.5–15.5)
WBC: 10.2 10*3/uL (ref 4.0–10.5)

## 2015-05-04 LAB — RPR: RPR Ser Ql: NONREACTIVE

## 2015-05-04 LAB — TYPE AND SCREEN
ABO/RH(D): A POS
ANTIBODY SCREEN: NEGATIVE

## 2015-05-04 MED ORDER — ZOLPIDEM TARTRATE 5 MG PO TABS: 5.0000 mg | ORAL_TABLET | Freq: Every evening | ORAL | Status: DC | PRN

## 2015-05-04 MED ORDER — BUTORPHANOL TARTRATE 1 MG/ML IJ SOLN
INTRAMUSCULAR | Status: AC
  Administered 2015-05-04: 1 mg via INTRAVENOUS
  Filled 2015-05-04: qty 1

## 2015-05-04 MED ORDER — TERBUTALINE SULFATE 1 MG/ML IJ SOLN
0.2500 mg | Freq: Once | INTRAMUSCULAR | Status: DC | PRN
Start: 2015-05-04 — End: 2015-05-04
  Filled 2015-05-04: qty 1

## 2015-05-04 MED ORDER — SENNOSIDES-DOCUSATE SODIUM 8.6-50 MG PO TABS
2.0000 | ORAL_TABLET | ORAL | Status: DC
  Administered 2015-05-05 (×2): 2 via ORAL
  Filled 2015-05-04 (×2): qty 2

## 2015-05-04 MED ORDER — COCONUT OIL OIL: 1.0000 "application " | TOPICAL_OIL | Status: DC | PRN

## 2015-05-04 MED ORDER — DIPHENHYDRAMINE HCL 50 MG/ML IJ SOLN: 12.5000 mg | INTRAMUSCULAR | Status: DC | PRN

## 2015-05-04 MED ORDER — LACTATED RINGERS IV SOLN
500.0000 mL | INTRAVENOUS | Status: DC | PRN
Start: 2015-05-04 — End: 2015-05-04

## 2015-05-04 MED ORDER — EPHEDRINE 5 MG/ML INJ
10.0000 mg | INTRAVENOUS | Status: DC | PRN
  Filled 2015-05-04: qty 2

## 2015-05-04 MED ORDER — ACETAMINOPHEN 325 MG PO TABS
650.0000 mg | ORAL_TABLET | ORAL | Status: DC | PRN
  Administered 2015-05-04 – 2015-05-05 (×4): 650 mg via ORAL
  Filled 2015-05-04 (×4): qty 2

## 2015-05-04 MED ORDER — PHENYLEPHRINE 40 MCG/ML (10ML) SYRINGE FOR IV PUSH (FOR BLOOD PRESSURE SUPPORT)
80.0000 ug | PREFILLED_SYRINGE | INTRAVENOUS | Status: DC | PRN
  Filled 2015-05-04: qty 5

## 2015-05-04 MED ORDER — BENZOCAINE-MENTHOL 20-0.5 % EX AERO
1.0000 "application " | INHALATION_SPRAY | CUTANEOUS | Status: DC | PRN
  Administered 2015-05-04 – 2015-05-05 (×2): 1 via TOPICAL
  Filled 2015-05-04 (×2): qty 56

## 2015-05-04 MED ORDER — FENTANYL 2.5 MCG/ML BUPIVACAINE 1/10 % EPIDURAL INFUSION (WH - ANES)
14.0000 mL/h | INTRAMUSCULAR | Status: DC | PRN
  Administered 2015-05-04: 14 mL/h via EPIDURAL
  Filled 2015-05-04: qty 125

## 2015-05-04 MED ORDER — SIMETHICONE 80 MG PO CHEW: 80.0000 mg | CHEWABLE_TABLET | ORAL | Status: DC | PRN

## 2015-05-04 MED ORDER — PHENYLEPHRINE 40 MCG/ML (10ML) SYRINGE FOR IV PUSH (FOR BLOOD PRESSURE SUPPORT)
80.0000 ug | PREFILLED_SYRINGE | INTRAVENOUS | Status: DC | PRN
  Filled 2015-05-04: qty 10
  Filled 2015-05-04: qty 5

## 2015-05-04 MED ORDER — ONDANSETRON HCL 4 MG PO TABS: 4.0000 mg | ORAL_TABLET | ORAL | Status: DC | PRN

## 2015-05-04 MED ORDER — LACTATED RINGERS IV SOLN
500.0000 mL | Freq: Once | INTRAVENOUS | Status: AC
  Administered 2015-05-04: 500 mL via INTRAVENOUS

## 2015-05-04 MED ORDER — FERROUS SULFATE 325 (65 FE) MG PO TABS
325.0000 mg | ORAL_TABLET | Freq: Two times a day (BID) | ORAL | Status: DC
  Administered 2015-05-04 – 2015-05-06 (×4): 325 mg via ORAL
  Filled 2015-05-04 (×4): qty 1

## 2015-05-04 MED ORDER — ONDANSETRON HCL 4 MG/2ML IJ SOLN: 4.0000 mg | Freq: Four times a day (QID) | INTRAMUSCULAR | Status: DC | PRN

## 2015-05-04 MED ORDER — LACTATED RINGERS IV SOLN
INTRAVENOUS | Status: DC
  Administered 2015-05-04: 01:00:00 via INTRAVENOUS

## 2015-05-04 MED ORDER — BUTORPHANOL TARTRATE 1 MG/ML IJ SOLN
1.0000 mg | Freq: Once | INTRAMUSCULAR | Status: AC
  Administered 2015-05-04: 1 mg via INTRAVENOUS

## 2015-05-04 MED ORDER — LIDOCAINE HCL (PF) 1 % IJ SOLN
30.0000 mL | INTRAMUSCULAR | Status: DC | PRN
  Filled 2015-05-04: qty 30

## 2015-05-04 MED ORDER — LIDOCAINE HCL (PF) 1 % IJ SOLN
INTRAMUSCULAR | Status: DC | PRN
  Administered 2015-05-04 (×2): 6 mL via EPIDURAL

## 2015-05-04 MED ORDER — OXYCODONE-ACETAMINOPHEN 5-325 MG PO TABS: 2.0000 | ORAL_TABLET | ORAL | Status: DC | PRN

## 2015-05-04 MED ORDER — CITRIC ACID-SODIUM CITRATE 334-500 MG/5ML PO SOLN: 30.0000 mL | ORAL | Status: DC | PRN

## 2015-05-04 MED ORDER — OXYTOCIN 10 UNIT/ML IJ SOLN
2.5000 [IU]/h | INTRAVENOUS | Status: DC
  Administered 2015-05-04: 2.5 [IU]/h via INTRAVENOUS

## 2015-05-04 MED ORDER — DIPHENHYDRAMINE HCL 25 MG PO CAPS: 25.0000 mg | ORAL_CAPSULE | Freq: Four times a day (QID) | ORAL | Status: DC | PRN

## 2015-05-04 MED ORDER — OXYCODONE-ACETAMINOPHEN 5-325 MG PO TABS: 1.0000 | ORAL_TABLET | ORAL | Status: DC | PRN

## 2015-05-04 MED ORDER — OXYTOCIN 10 UNIT/ML IJ SOLN
1.0000 m[IU]/min | INTRAVENOUS | Status: DC
  Administered 2015-05-04: 2 m[IU]/min via INTRAVENOUS
  Filled 2015-05-04: qty 4

## 2015-05-04 MED ORDER — IBUPROFEN 600 MG PO TABS
600.0000 mg | ORAL_TABLET | Freq: Four times a day (QID) | ORAL | Status: DC
  Administered 2015-05-04 – 2015-05-06 (×8): 600 mg via ORAL
  Filled 2015-05-04 (×9): qty 1

## 2015-05-04 MED ORDER — ONDANSETRON HCL 4 MG/2ML IJ SOLN: 4.0000 mg | INTRAMUSCULAR | Status: DC | PRN

## 2015-05-04 MED ORDER — ACETAMINOPHEN 325 MG PO TABS: 650.0000 mg | ORAL_TABLET | ORAL | Status: DC | PRN

## 2015-05-04 MED ORDER — DIBUCAINE 1 % RE OINT: 1.0000 "application " | TOPICAL_OINTMENT | RECTAL | Status: DC | PRN

## 2015-05-04 MED ORDER — WITCH HAZEL-GLYCERIN EX PADS
1.0000 | MEDICATED_PAD | CUTANEOUS | Status: DC | PRN
Start: 2015-05-04 — End: 2015-05-06

## 2015-05-04 MED ORDER — OXYTOCIN BOLUS FROM INFUSION: 500.0000 mL | INTRAVENOUS | Status: DC

## 2015-05-04 MED ORDER — PRENATAL MULTIVITAMIN CH
1.0000 | ORAL_TABLET | Freq: Every day | ORAL | Status: DC
  Administered 2015-05-04 – 2015-05-05 (×2): 1 via ORAL
  Filled 2015-05-04 (×3): qty 1

## 2015-05-04 MED ORDER — TETANUS-DIPHTH-ACELL PERTUSSIS 5-2.5-18.5 LF-MCG/0.5 IM SUSP: 0.5000 mL | Freq: Once | INTRAMUSCULAR | Status: DC

## 2015-05-04 NOTE — H&P (Signed)
This is Dr. Francoise CeoBernard Marshall dictating the history and physical on  Meagan Burgess she is a 25 year old gravida 3 para 2002 EDC 05/04/2015 negative GBS desired induction she was admitted last night and is on low-dose Pitocin her membranes ruptured spontaneously at 6:30 this morning fluids clear she is 3 cm 80-90% vertex -1 and contracting every 2-3 minutes her pregnancy was uncomplicated Past medical history negative Past surgical history negative Social history negative System review negative Physical exam well-developed female in labor HEENT negative Lungs clear to P&A Heart regular rhythm no murmurs no gallops Breasts negative Abdomen term Pelvic as described above Extremities negative

## 2015-05-04 NOTE — Progress Notes (Signed)
UR chart review completed.  

## 2015-05-04 NOTE — Anesthesia Postprocedure Evaluation (Signed)
Anesthesia Post Note  Patient: Meagan Burgess  Procedure(s) Performed: * No procedures listed *  Patient location during evaluation: Mother Baby Anesthesia Type: Epidural Level of consciousness: awake Pain management: pain level controlled Vital Signs Assessment: post-procedure vital signs reviewed and stable Respiratory status: spontaneous breathing Cardiovascular status: stable Postop Assessment: no headache, no backache, epidural receding, patient able to bend at knees, no signs of nausea or vomiting and adequate PO intake Anesthetic complications: no     Last Vitals:  Filed Vitals:   05/04/15 1107 05/04/15 1130  BP:  126/62  Pulse:  64  Temp: 37 C 37 C  Resp:  18    Last Pain:  Filed Vitals:   05/04/15 1149  PainSc: 4    Pain Goal: Patients Stated Pain Goal: 4 (05/04/15 0818)               Fanny DanceMULLINS,Meagan Burgess

## 2015-05-04 NOTE — Anesthesia Procedure Notes (Signed)
Epidural Patient location during procedure: OB Start time: 05/04/2015 4:00 AM End time: 05/04/2015 4:04 AM  Staffing Anesthesiologist: Leilani AbleHATCHETT, Kesean Serviss Performed by: anesthesiologist   Preanesthetic Checklist Completed: patient identified, surgical consent, pre-op evaluation, timeout performed, IV checked, risks and benefits discussed and monitors and equipment checked  Epidural Patient position: sitting Prep: site prepped and draped and DuraPrep Patient monitoring: continuous pulse ox and blood pressure Approach: midline Location: L3-L4 Injection technique: LOR air  Needle:  Needle type: Tuohy  Needle gauge: 17 G Needle length: 9 cm and 9 Needle insertion depth: 6 cm Catheter type: closed end flexible Catheter size: 19 Gauge Catheter at skin depth: 11 cm Test dose: negative and Other  Assessment Sensory level: T9 Events: blood not aspirated, injection not painful, no injection resistance, negative IV test and no paresthesia  Additional Notes Reason for block:procedure for pain

## 2015-05-04 NOTE — Anesthesia Preprocedure Evaluation (Signed)
Anesthesia Evaluation  Patient identified by MRN, date of birth, ID band Patient awake    Reviewed: Allergy & Precautions, H&P , NPO status , Patient's Chart, lab work & pertinent test results  Airway Mallampati: I  TM Distance: >3 FB Neck ROM: full    Dental no notable dental hx.    Pulmonary Current Smoker,    Pulmonary exam normal        Cardiovascular negative cardio ROS Normal cardiovascular exam     Neuro/Psych negative neurological ROS  negative psych ROS   GI/Hepatic negative GI ROS, Neg liver ROS,   Endo/Other  negative endocrine ROS  Renal/GU negative Renal ROS     Musculoskeletal   Abdominal Normal abdominal exam  (+)   Peds  Hematology negative hematology ROS (+)   Anesthesia Other Findings   Reproductive/Obstetrics (+) Pregnancy                             Anesthesia Physical Anesthesia Plan  ASA: II  Anesthesia Plan: Epidural   Post-op Pain Management:    Induction:   Airway Management Planned:   Additional Equipment:   Intra-op Plan:   Post-operative Plan:   Informed Consent: I have reviewed the patients History and Physical, chart, labs and discussed the procedure including the risks, benefits and alternatives for the proposed anesthesia with the patient or authorized representative who has indicated his/her understanding and acceptance.     Plan Discussed with:   Anesthesia Plan Comments:         Anesthesia Quick Evaluation  

## 2015-05-04 NOTE — Lactation Note (Addendum)
This note was copied from a baby's chart. Lactation Consultation Note  Patient Name: Meagan Burgess [id number]'s Date: 05/04/2015 Reason for consult: Initial assessment Baby at 6 hr of life and starting to wake. Baby has a gape of the jaws and grasps breast easily. She will take 3-5 sucks then fall asleep with nipple in her mouth. Mom has limited bf experience. She stated her oldest did not like latching and caused "a lot" of nipple pain. Her middle child bf well but she went back to work at 6 wk and was not able to pump so her supply dropped. She plans to bf this baby while she is home and have babysitter offer formula while she is away. She thinks she might try to pump at work but is unsure she will have the time. Discussed baby behavior, feeding frequency, baby belly size, voids, wt loss, breast changes, and nipple care. Demonstrated manual expression, large drops of colostrum noted bilaterally, spoon in room. Given lactation and ABM herpes handouts. Aware of OP services and support group. Mom will bf baby on demand 8+/24hr. She is aware of engorgement treatment/prevention. She will f/u with WIC on 05/05/15.      Maternal Data Has patient been taught Hand Expression?: Yes Does the patient have breastfeeding experience prior to this delivery?: Yes  Feeding Feeding Type: Breast Fed Length of feed: 5 min  LATCH Score/Interventions Latch: Too sleepy or reluctant, no latch achieved, no sucking elicited. Intervention(s): Skin to skin;Teach feeding cues;Waking techniques  Audible Swallowing: A few with stimulation Intervention(s): Hand expression;Skin to skin Intervention(s): Alternate breast massage  Type of Nipple: Everted at rest and after stimulation  Comfort (Breast/Nipple): Soft / non-tender     Hold (Positioning): Assistance needed to correctly position infant at breast and maintain latch. Intervention(s): Position options;Support Pillows  LATCH Score: 6  Lactation Tools  Discussed/Used WIC Program: Yes   Consult Status Consult Status: Follow-up Date: 05/05/15 Follow-up type: In-patient    Rulon Eisenmengerlizabeth E Ashlea Dusing 05/04/2015, 4:08 PM

## 2015-05-05 LAB — CBC
HCT: 29.1 % — ABNORMAL LOW (ref 36.0–46.0)
Hemoglobin: 9.4 g/dL — ABNORMAL LOW (ref 12.0–15.0)
MCH: 27.6 pg (ref 26.0–34.0)
MCHC: 32.3 g/dL (ref 30.0–36.0)
MCV: 85.6 fL (ref 78.0–100.0)
Platelets: 220 10*3/uL (ref 150–400)
RBC: 3.4 MIL/uL — ABNORMAL LOW (ref 3.87–5.11)
RDW: 13.8 % (ref 11.5–15.5)
WBC: 7.9 10*3/uL (ref 4.0–10.5)

## 2015-05-05 LAB — CULTURE, OB URINE
Culture: >=100000 — AB
Special Requests: NORMAL

## 2015-05-05 MED ORDER — AMPICILLIN 500 MG PO CAPS
500.0000 mg | ORAL_CAPSULE | Freq: Four times a day (QID) | ORAL | Status: DC
  Administered 2015-05-05 – 2015-05-06 (×5): 500 mg via ORAL
  Filled 2015-05-05 (×9): qty 1

## 2015-05-05 NOTE — Progress Notes (Signed)
Patient declines tdap vaccine

## 2015-05-05 NOTE — Progress Notes (Signed)
Patient ID: Meagan HartsPriscilla A Burgess, female   DOB: 09/29/1990, 25 y.o.   MRN: [id number] Postpartum day one Blood pressure 123 with 64 pulse 64 respiration 18 Fundus firm Lochia moderate Legs negative

## 2015-05-06 NOTE — Discharge Summary (Signed)
Obstetric Discharge Summary Reason for Admission: onset of labor Prenatal Procedures: none Intrapartum Procedures: spontaneous vaginal delivery Postpartum Procedures: none Complications-Operative and Postpartum: none HEMOGLOBIN  Date Value Ref Range Status  05/05/2015 9.4* 12.0 - 15.0 g/dL Final   HCT  Date Value Ref Range Status  05/05/2015 29.1* 36.0 - 46.0 % Final    Physical Exam:  General: alert Lochia: appropriate Uterine Fundus: firm Incision: healing well DVT Evaluation: No evidence of DVT seen on physical exam.  Discharge Diagnoses: Term Pregnancy-delivered  Discharge Information: Date: 05/06/2015 Activity: pelvic rest Diet: routine Medications: Percocet Condition: stable Instructions: refer to practice specific booklet Discharge to: home Follow-up Information    Follow up with Kathreen CosierMARSHALL,Aaliayah Miao A, MD.   Specialty:  Obstetrics and Gynecology   Contact information:   [address] [place] [id number] [phone]       Newborn Data: Live born female  Birth Weight: 6 lb 9.8 oz (3000 g) APGAR: 7, 9  Home with mother.  Meagan Burgess A 05/06/2015, 6:41 AM

## 2015-05-06 NOTE — Discharge Instructions (Signed)
Discharge instructions ° °· You can wash your hair °· Shower °· Eat what you want °· Drink what you want °· See me in 6 weeks °· Your ankles are going to swell more in the next 2 weeks than when pregnant °· No sex for 6 weeks ° ° °Jamar Casagrande A, MD 05/06/2015 ° ° °

## 2015-05-06 NOTE — Progress Notes (Signed)
Patient ID: Meagan HartsPriscilla A Nicklin, female   DOB: 12/17/1990, 25 y.o.   MRN: [id number] Postpartum day 2 Blood pressure 111/67 pulse 57 respiration 18 Fundus firm Lochia moderate Legs negative home today

## 2015-05-06 NOTE — Lactation Note (Signed)
This note was copied from a baby's chart. Lactation Consultation Note Observed BF. Baby latches easily suckles for a couple of minutes and then stops feeding. Helped mother to use breast compressions to move more milk into the baby. More swallows were heard following this. Mom taught hand expression and spoon feeding. Baby extended her tongue and lapped the milk.  After this baby has a LARGE stool and a significant void. Mother felt better after seeing this output.  Encouraged to call for outpatient support as needed.  Patient Name: Meagan Burgess AVWUJ'WToday's Date: 05/06/2015 Reason for consult: Follow-up assessment   Maternal Data    Feeding Feeding Type: Breast Fed Length of feed: 5 min  LATCH Score/Interventions Latch: Repeated attempts needed to sustain latch, nipple held in mouth throughout feeding, stimulation needed to elicit sucking reflex. Intervention(s): Skin to skin Intervention(s): Breast massage;Breast compression  Audible Swallowing: A few with stimulation Intervention(s): Skin to skin;Hand expression  Type of Nipple: Everted at rest and after stimulation  Comfort (Breast/Nipple): Soft / non-tender     Hold (Positioning): Assistance needed to correctly position infant at breast and maintain latch.  LATCH Score: 7  Lactation Tools Discussed/Used     Consult Status Consult Status: PRN    Soyla DryerJoseph, Trenton Passow 05/06/2015, 11:58 AM

## 2015-10-28 ENCOUNTER — Ambulatory Visit (HOSPITAL_COMMUNITY)
Admission: RE | Admit: 2015-10-28 | Discharge: 2015-10-28 | Disposition: A | Discharge status: Home / Self Care | Payer: Medicaid Other | Source: Ambulatory Visit | Attending: Obstetrics & Gynecology | Admitting: Obstetrics & Gynecology

## 2015-10-28 ENCOUNTER — Telehealth (HOSPITAL_COMMUNITY): Payer: Self-pay

## 2015-10-28 NOTE — Lactation Note (Addendum)
Lactation Consult for Shane Al (mother)   Mother's reason for visit: "milk will not come out" of R breast Consult:  Initial Lactation Consultant:  ,  [place]  ________________________________________________________________________ BW: 6# 8 oz. Infant, Londyn, is now 5.5 months old. She is obviously gaining well.  ________________________________________________________________________  Mother's Name: Delanna A Laforte Type of delivery:   Breastfeeding Experience: this is the longest she has breastfed one of her children. The other children were nursed for 1-2 weeks.  Maternal Medical Conditions:  None Maternal Medications: Flintstones  ________________________________________________________________________  Breastfeeding History (Post Discharge)  Frequency of breastfeeding: 8 or more times/day ("every hour usually" when Mom is at home). Mom does not pump/express her milk when she is away from infant.  Duration of feeding: 5-60 minutes  ________________________________________________________________________  Maternal Breast Assessment  Breast:  R breast full; L breast soft Nipple:  Erect  Pain scale "7" _______________________________________________________________________ Feeding Assessment/Evaluation  Initial feeding assessment:  Infant started receiving formula at 2 months of age. She gets about 2-3 bottles of formula (7 oz per bottle), (14-21 ounces of formula/day) while Mom is at work. Infant is also eating baby food.   Assessment: Mom's L breast is soft, but R breast is full (not engorged). We used a DEBP to pump the R breast (along with moist heat & massage), but only about 5mL was expressed. Infant was also offered the R breast multiple times, but infant would only latch briefly & become fussy that milk was not freely coming out. A nipple bleb was noted on the surface of Mom's R nipple, which Mom said was of new onset (noticed yesterday).   Mom was given instructions to apply moist heat (e.g. washcloth) to milk blister before nursing/pumping. Mom was also encouraged to do epsom salt soaks of nipple for 5-10 minutes/at a time, following with using a clean washcloth that had been soaked in warm/hot water to see if she could remove surface of nipple bleb. Mom was told she could do the soaks/gentle exfoliation with a washcloth a few times/day or as often as desired, provided that nipple irritation was not occurring. Mom cautioned not to be aggressive in doing this. Mom was given anticipatory guidance on what the plug may look like when released and also that it was ok if the plug was released over a series of feedings/pumping sessions. Once bleb is released, Mom can use mild soap and water to clean surface of nipple or polysporin.   Mom shown how to assemble & use hand pump that was included in pump kit. Size 21 flanges are appropriate size for Mom & were used during pumping. Mom knows to call us if there is no improvement in milk blister by Friday afternoon. Mom very thankful for our assistance.  Mom also encouraged to take her hand pump to work to pump for a few minutes as needed to prevent milk stasis from reoccurring.  Kim , RN, IBCLC   

## 2015-10-28 NOTE — Telephone Encounter (Addendum)
Attempt was made to return mom's call.  No answer.  Message was left.  LC notified that patient had arrived to be seen as walk-in.

## 2015-12-03 ENCOUNTER — Ambulatory Visit (INDEPENDENT_AMBULATORY_CARE_PROVIDER_SITE_OTHER): Payer: Medicaid Other | Admitting: Obstetrics and Gynecology

## 2015-12-03 ENCOUNTER — Encounter: Payer: Self-pay | Admitting: Obstetrics and Gynecology

## 2015-12-03 VITALS — BP 119/70 | HR 89 | Temp 98.1°F | Ht 61.0 in | Wt 124.2 lb

## 2015-12-03 DIAGNOSIS — Z30013 Encounter for initial prescription of injectable contraceptive: Secondary | ICD-10-CM

## 2015-12-03 DIAGNOSIS — Z720 Tobacco use: Secondary | ICD-10-CM | POA: Diagnosis not present

## 2015-12-03 DIAGNOSIS — Z3202 Encounter for pregnancy test, result negative: Secondary | ICD-10-CM | POA: Diagnosis not present

## 2015-12-03 DIAGNOSIS — Z30019 Encounter for initial prescription of contraceptives, unspecified: Secondary | ICD-10-CM

## 2015-12-03 LAB — POCT URINE PREGNANCY: Preg Test, Ur: NEGATIVE

## 2015-12-03 MED ORDER — PRENATAL VITAMIN 27-0.8 MG PO TABS: 1.0000 | ORAL_TABLET | Freq: Every day | ORAL | 2 refills | Status: DC

## 2015-12-03 MED ORDER — MEDROXYPROGESTERONE ACETATE 150 MG/ML IM SUSP: 150.0000 mg | INTRAMUSCULAR | 3 refills | Status: DC

## 2015-12-03 MED ORDER — LEVONORGESTREL 1.5 MG PO TABS: 1.5000 mg | ORAL_TABLET | Freq: Once | ORAL | 2 refills | Status: AC

## 2015-12-03 NOTE — Progress Notes (Signed)
Patient is in the office to discuss birth control options, pt states that menstrual cycle has been irregular since her delivery in May, LMP was in August.

## 2015-12-03 NOTE — Progress Notes (Addendum)
Obstetrics and Gynecology Visit Established Patient Evaluation  Appointment Date: 12/03/2015  [facility]  Primary Care Provider: No PCP Per Patient  Referring Provider: self  Chief Complaint: contraception management  History of Present Illness: Meagan Burgess is a 25 y.o. Caucasian [id number] (LMP: August or September), seen for the above chief complaint. Her past medical history is significant for tobacco abuse  Patient had NSVD in May 2017 with Dr. Gaynell FaceMarshall with uncomplicated pregnancy; perineal laceration not specified but something was repaired with 2-0 . [facility]. [facility]. She rarely bottle feeds and is breastfeeding otherwise and she had a period for 5 days about 2-4735m ago and has been amenorrheic since. Pt states she used depo in the remote past and aside from spotting initially with it for a few months she had no issues with it. Had unprotected intercourse this morning.   Mood is good, no bowel (chronic constipation) or urinary issues, no problems with sexual intercourse.    Review of Systems: as noted in the History of Present Illness.  Patient Active Problem List   Diagnosis Date Noted  . Tobacco abuse 12/03/2015     Past Medical History:  Past Medical History:  Diagnosis Date  . Abnormal Pap smear    colpo- wnl  . Herpes   . Urinary tract infection     Past Surgical History:  Past Surgical History:  Procedure Laterality Date  . NO PAST SURGERIES      Past Obstetrical History:  OB History  Gravida Para Term Preterm AB Living  3 3 3  0 0 3  SAB TAB Ectopic Multiple Live Births  0 0 0 0 3    # Outcome Date GA Lbr Len/2nd Weight Sex Delivery Anes PTL Lv  3 Term 05/04/15 5042w0d 433:22 / 00:14 6 lb 9.8 oz (3 kg) F Vag-Spont EPI  LIV  2 Term 11/15/11 3162w0d 06:10 / 00:06 6 lb 4 oz (2.835 kg) F Vag-Spont EPI  LIV  1 Term 2011 2142w0d  7 lb 5 oz (3.317 kg) M  Vag-Spont   LIV     Past Gynecological History: As per HPI.  Social History:  Social History   Social History  . Marital status: Single    Spouse name: N/A  . Number of children: N/A  . Years of education: N/A   Occupational History  . Not on file.   Social History Main Topics  . Smoking status: Current Every Day Smoker    Packs/day: 0.25    Years: 6.00    Types: Cigarettes    Last attempt to quit: 04/26/2011  . Smokeless tobacco: Never Used  . Alcohol use No  . Drug use: No  . Sexual activity: Yes    Birth control/ protection: None   Other Topics Concern  . Not on file   Social History Narrative  . No narrative on file    Family History:  Family History  Problem Relation Age of Onset  . Anesthesia problems Neg Hx   . Hypertension Father   . Hypertension Maternal Grandmother   . Cancer Maternal Grandfather   . Diabetes Paternal Grandmother     Medications MVI   Allergies Patient has no known allergies.   Physical Exam:  BP 119/70   Pulse 89   Temp 98.1 F (36.7 C) (Oral)   Ht 5\' 1"  (1.549 m)   Wt 124 lb  3.2 oz (56.3 kg)   Breastfeeding? Yes   BMI 23.47 kg/m  Body mass index is 23.47 kg/m. General appearance: Well nourished, well developed female in no acute distress.  Respiratory:   Normal respiratory effort Neuro/Psych:  Normal mood and affect.  Skin:  Warm and dry.   Laboratory: paper chart shows normal pap in 2017 with Dr. Gaynell FaceMarshall. UPT negative today  Radiology: none  Assessment: pt doing well  Plan:  Options d/w pt and she would like to do depo provera again and she'd like to do Plan B. PNVs, Plan B and depo sent in. Will bring patient back for RN only lab visit for UPT and depo shot in 2-3wks.  D/w pt that AUB is common PP and especially if she's breastfeeding that much and likely lactational amenorrhea. Pt told that can expect some AUB with initiation of depo.   Stool softeners advised for chronic constipation (ongoing issue for  years per patient)  Orders Placed This Encounter  Procedures  . Procedure Report - Scanned  . POCT urine pregnancy   9860m with >50% spent in counseling and coordination of care.   Cornelia Copaharlie Nyair Depaulo, Jr MD Attending [facility](Faculty Practice)

## 2015-12-17 ENCOUNTER — Ambulatory Visit (INDEPENDENT_AMBULATORY_CARE_PROVIDER_SITE_OTHER): Payer: Medicaid Other

## 2015-12-17 DIAGNOSIS — Z3042 Encounter for surveillance of injectable contraceptive: Secondary | ICD-10-CM | POA: Diagnosis not present

## 2015-12-17 DIAGNOSIS — N926 Irregular menstruation, unspecified: Secondary | ICD-10-CM

## 2015-12-17 DIAGNOSIS — Z3202 Encounter for pregnancy test, result negative: Secondary | ICD-10-CM | POA: Diagnosis not present

## 2015-12-17 LAB — POCT URINE PREGNANCY: Preg Test, Ur: NEGATIVE

## 2015-12-17 MED ORDER — MEDROXYPROGESTERONE ACETATE 150 MG/ML IM SUSP
150.0000 mg | Freq: Once | INTRAMUSCULAR | Status: AC
  Administered 2015-12-17: 150 mg via INTRAMUSCULAR

## 2016-03-07 ENCOUNTER — Ambulatory Visit (INDEPENDENT_AMBULATORY_CARE_PROVIDER_SITE_OTHER): Payer: Medicaid Other | Admitting: Obstetrics

## 2016-03-07 ENCOUNTER — Other Ambulatory Visit (HOSPITAL_COMMUNITY)
Admission: RE | Admit: 2016-03-07 | Discharge: 2016-03-07 | Disposition: A | Discharge status: Home / Self Care | Payer: Medicaid Other | Source: Ambulatory Visit | Attending: Obstetrics | Admitting: Obstetrics

## 2016-03-07 ENCOUNTER — Encounter: Payer: Self-pay | Admitting: Obstetrics

## 2016-03-07 VITALS — BP 117/75 | HR 64 | Wt 124.0 lb

## 2016-03-07 DIAGNOSIS — N939 Abnormal uterine and vaginal bleeding, unspecified: Secondary | ICD-10-CM

## 2016-03-07 DIAGNOSIS — Z3009 Encounter for other general counseling and advice on contraception: Secondary | ICD-10-CM | POA: Diagnosis not present

## 2016-03-07 DIAGNOSIS — B369 Superficial mycosis, unspecified: Secondary | ICD-10-CM

## 2016-03-07 LAB — POCT URINE PREGNANCY: Preg Test, Ur: NEGATIVE

## 2016-03-07 MED ORDER — MEDROXYPROGESTERONE ACETATE 150 MG/ML IM SUSP: 150.0000 mg | Freq: Once | INTRAMUSCULAR | Status: AC

## 2016-03-07 MED ORDER — CLOTRIMAZOLE 1 % EX CREA: 1.0000 "application " | TOPICAL_CREAM | Freq: Two times a day (BID) | CUTANEOUS | 2 refills | Status: DC

## 2016-03-07 MED ORDER — MEDROXYPROGESTERONE ACETATE 10 MG PO TABS: 10.0000 mg | ORAL_TABLET | Freq: Every day | ORAL | 0 refills | Status: DC

## 2016-03-07 NOTE — Progress Notes (Signed)
Subjective:    Meagan Burgess is a 26 y.o. female who presents for contraception counseling. The patient has no complaints today. The patient is not currently sexually active. Pertinent past medical history: current smoker.  The information documented in the HPI was reviewed and verified.  Menstrual History: OB History    Gravida Para Term Preterm AB Living   3 3 3  0 0 3   SAB TAB Ectopic Multiple Live Births   0 0 0 0 3      Patient's last menstrual period was 01/10/2016 (approximate).   Patient Active Problem List   Diagnosis Date Noted  . Tobacco abuse 12/03/2015   Past Medical History:  Diagnosis Date  . Abnormal Pap smear    colpo- wnl  . Herpes   . Urinary tract infection     Past Surgical History:  Procedure Laterality Date  . NO PAST SURGERIES       Current Outpatient Prescriptions:  .  clotrimazole (LOTRIMIN) 1 % cream, Apply 1 application topically 2 (two) times daily., Disp: 113 g, Rfl: 2 .  medroxyPROGESTERone (DEPO-PROVERA) 150 MG/ML injection, Inject 1 mL (150 mg total) into the muscle every 3 (three) months., Disp: 1 mL, Rfl: 3 .  medroxyPROGESTERone (PROVERA) 10 MG tablet, Take 1 tablet (10 mg total) by mouth daily., Disp: 10 tablet, Rfl: 0 .  Prenatal Vit-Fe Fumarate-FA (PRENATAL VITAMIN) 27-0.8 MG TABS, Take 1 tablet by mouth daily., Disp: 60 tablet, Rfl: 2  Current Facility-Administered Medications:  .  medroxyPROGESTERone (DEPO-PROVERA) injection 150 mg, 150 mg, Intramuscular, Once, Brock Bad, MD No Known Allergies  Social History  Substance Use Topics  . Smoking status: Current Every Day Smoker    Packs/day: 0.25    Years: 6.00    Types: Cigarettes    Last attempt to quit: 04/26/2011  . Smokeless tobacco: Never Used  . Alcohol use No    Family History  Problem Relation Age of Onset  . Anesthesia problems Neg Hx   . Hypertension Father   . Hypertension Maternal Grandmother   . Cancer Maternal Grandfather   . Diabetes Paternal  Grandmother        Review of Systems Constitutional: negative for weight loss Genitourinary:negative for abnormal menstrual periods and vaginal discharge   Objective:   BP 117/75   Pulse 64   Wt 124 lb (56.2 kg)   LMP 01/10/2016 (Approximate)   Breastfeeding? Yes Comment: bleeding since 01/10/16  BMI 23.43 kg/m    PE:          General:  Alert and no distress        Abdomen:  Erythematous rash lower abdomen        Pelvic:  Deferred   Lab Review Urine pregnancy test Labs reviewed yes Radiologic studies reviewed no  50% of 10 min visit spent on counseling and coordination of care.    Assessment:    26 y.o., continuing Depo-Provera injections, no contraindications.   AUB Superficial fungal infection of abdomen    Plan:   Provera Rx for AUB Clotrimazole cream Rx for dermatitis  All questions answered. Chlamydia specimen. Contraception: Depo-Provera injections. Diagnosis explained in detail, including differential. Discussed healthy lifestyle modifications. Follow up as needed. GC specimen. Urinalysis. UPT negative    Meds ordered this encounter  Medications  . medroxyPROGESTERone (DEPO-PROVERA) injection 150 mg  . clotrimazole (LOTRIMIN) 1 % cream    Sig: Apply 1 application topically 2 (two) times daily.    Dispense:  113 g  Refill:  2  . medroxyPROGESTERone (PROVERA) 10 MG tablet    Sig: Take 1 tablet (10 mg total) by mouth daily.    Dispense:  10 tablet    Refill:  0   Orders Placed This Encounter  Procedures  . POCT urine pregnancy

## 2016-03-08 ENCOUNTER — Other Ambulatory Visit: Payer: Self-pay | Admitting: Obstetrics

## 2016-03-08 ENCOUNTER — Ambulatory Visit: Payer: Medicaid Other

## 2016-03-08 DIAGNOSIS — Z202 Contact with and (suspected) exposure to infections with a predominantly sexual mode of transmission: Secondary | ICD-10-CM

## 2016-03-08 LAB — URINE CYTOLOGY ANCILLARY ONLY
Chlamydia: NEGATIVE
Neisseria Gonorrhea: NEGATIVE
Trichomonas: POSITIVE — AB

## 2016-03-08 MED ORDER — TINIDAZOLE 500 MG PO TABS: 2.0000 g | ORAL_TABLET | Freq: Once | ORAL | 0 refills | Status: AC

## 2016-03-09 ENCOUNTER — Telehealth: Payer: Self-pay

## 2016-03-09 NOTE — Telephone Encounter (Signed)
Contacted pharmacy to advise of approval for prior authorization.

## 2016-03-11 ENCOUNTER — Ambulatory Visit: Payer: Medicaid Other

## 2016-03-14 LAB — URINE CYTOLOGY ANCILLARY ONLY
Bacterial vaginitis: POSITIVE — AB
Candida vaginitis: NEGATIVE

## 2016-03-15 ENCOUNTER — Other Ambulatory Visit: Payer: Self-pay | Admitting: Obstetrics

## 2016-03-15 DIAGNOSIS — N760 Acute vaginitis: Principal | ICD-10-CM

## 2016-03-15 DIAGNOSIS — B9689 Other specified bacterial agents as the cause of diseases classified elsewhere: Secondary | ICD-10-CM

## 2016-03-15 MED ORDER — TINIDAZOLE 500 MG PO TABS: 1000.0000 mg | ORAL_TABLET | Freq: Every day | ORAL | 2 refills | Status: DC

## 2016-04-02 ENCOUNTER — Encounter (HOSPITAL_COMMUNITY): Payer: Self-pay | Admitting: Emergency Medicine

## 2016-04-02 ENCOUNTER — Emergency Department (HOSPITAL_COMMUNITY)
Admission: EM | Admit: 2016-04-02 | Discharge: 2016-04-02 | Disposition: A | Discharge status: Home / Self Care | Payer: Medicaid Other | Attending: Physician Assistant | Admitting: Physician Assistant

## 2016-04-02 DIAGNOSIS — Z79899 Other long term (current) drug therapy: Secondary | ICD-10-CM | POA: Diagnosis not present

## 2016-04-02 DIAGNOSIS — F17210 Nicotine dependence, cigarettes, uncomplicated: Secondary | ICD-10-CM | POA: Diagnosis not present

## 2016-04-02 DIAGNOSIS — R0989 Other specified symptoms and signs involving the circulatory and respiratory systems: Secondary | ICD-10-CM | POA: Diagnosis present

## 2016-04-02 MED ORDER — GI COCKTAIL ~~LOC~~: 30.0000 mL | Freq: Once | ORAL | Status: DC

## 2016-04-02 NOTE — ED Triage Notes (Signed)
Pt reports onset at 6 pm last night while eating Penne pasta, pt swallowed a piece of pasta whole and feels like its stuck in her throat. Pt tried drinking soda and eating bread without relief. Pt able to talk in complete sentences without difficulty. No drooling noted.

## 2016-04-02 NOTE — ED Provider Notes (Signed)
[facility]-EMERGENCY DEPT Provider Note   CSN: [id number] Arrival date & time: 04/02/16  4098     History   Chief Complaint Chief Complaint  Patient presents with  . Swallowed Foreign Body    HPI Meagan Burgess is a 26 y.o. female.  Meagan Burgess is a 26 y.o. Female who presents to the emergency department complaining of a foreign body sensation in her throat after eating pot so last night at 6 PM. Patient reports she was eating some posture and 6 PM last night when she felt that some pasta became stuck in her throat. She reports since then she has pain to her throat and feels's stuck there. She reports she has been drinking soda and food without relief. She's had no vomiting. No trouble with her secretions. No drooling. She denies history of this happening to her previously. She denies fevers, chest pain, shortness of breath, tongue swelling, lip swelling, abdominal pain, nausea or vomiting.   The history is provided by the patient and medical records. No language interpreter was used.  Swallowed Foreign Body  Pertinent negatives include no chest pain, no abdominal pain, no headaches and no shortness of breath.    Past Medical History:  Diagnosis Date  . Abnormal Pap smear    colpo- wnl  . Herpes   . Urinary tract infection     Patient Active Problem List   Diagnosis Date Noted  . Tobacco abuse 12/03/2015    Past Surgical History:  Procedure Laterality Date  . NO PAST SURGERIES      OB History    Gravida Para Term Preterm AB Living   0 0 3   SAB TAB Ectopic Multiple Live Births   0 0 0 0 3       Home Medications    Prior to Admission medications   Medication Sig Start Date End Date Taking? Authorizing Provider  clotrimazole (LOTRIMIN) 1 % cream Apply 1 application topically 2 (two) times daily. Patient taking differently: Apply 1 application topically as needed (for rash).  03/07/16  Yes Brock Bad, MD  medroxyPROGESTERone (DEPO-PROVERA)  150 MG/ML injection Inject 1 mL (150 mg total) into the muscle every 3 (three) months. 12/03/15  Yes Bynum Bing, MD  Multiple Vitamins-Minerals (MULTIVITAMIN PO) Take 1 tablet by mouth 2 (two) times a week.   Yes Historical Provider, MD  medroxyPROGESTERone (PROVERA) 10 MG tablet Take 1 tablet (10 mg total) by mouth daily. Patient not taking: Reported on 04/02/2016 03/07/16   Brock Bad, MD  Prenatal Vit-Fe Fumarate-FA (PRENATAL VITAMIN) 27-0.8 MG TABS Take 1 tablet by mouth daily. Patient not taking: Reported on 04/02/2016 12/03/15   [place] Bing, MD  tinidazole [facility]) 500 MG tablet Take 2 tablets (1,000 mg total) by mouth daily with breakfast. Patient not taking: Reported on 04/02/2016 03/15/16   Brock Bad, MD    Family History Family History  Problem Relation Age of Onset  . Hypertension Father   . Hypertension Maternal Grandmother   . Cancer Maternal Grandfather   . Diabetes Paternal Grandmother   . Anesthesia problems Neg Hx     Social History Social History  Substance Use Topics  . Smoking status: Current Every Day Smoker    Packs/day: 0.25    Years: 6.00    Types: Cigarettes    Last attempt to quit: 04/26/2011  . Smokeless tobacco: Never Used  . Alcohol use No     Allergies   Patient has no  known allergies.   Review of Systems Review of Systems  Constitutional: Negative for chills and fever.  HENT: Positive for sore throat. Negative for congestion, drooling, trouble swallowing and voice change.   Eyes: Negative for visual disturbance.  Respiratory: Negative for cough, shortness of breath and stridor.   Cardiovascular: Negative for chest pain.  Gastrointestinal: Negative for abdominal pain, nausea and vomiting.  Genitourinary: Negative for dysuria.  Musculoskeletal: Negative for neck pain.  Skin: Negative for rash.  Neurological: Negative for headaches.     Physical Exam Updated Vital Signs BP 121/87 (BP Location: Right Arm)   Pulse 97    Temp 97.9 F (36.6 C) (Oral)   Resp 16   Ht  (1.575 m)   Wt 59 kg   SpO2 99%   Breastfeeding? Yes   BMI 23.78 kg/m   Physical Exam  Constitutional: She appears well-developed and well-nourished. No distress.  Nontoxic appearing.  HENT:  Head: Normocephalic and atraumatic.  Right Ear: External ear normal.  Left Ear: External ear normal.  Mouth/Throat: Oropharynx is clear and moist.  Throat is clear. Uvula is midline without edema. No posterior oropharyngeal erythema or edema. No evidence of foreign body in throat on visual exam. No drooling. No stridor. She is handling her secretions without difficulty.  Eyes: Conjunctivae are normal. Pupils are equal, round, and reactive to light. Right eye exhibits no discharge. Left eye exhibits no discharge.  Neck: Normal range of motion. Neck supple. No tracheal deviation present.  Cardiovascular: Normal rate, regular rhythm, normal heart sounds and intact distal pulses.   Pulmonary/Chest: Effort normal and breath sounds normal. No stridor. No respiratory distress. She has no wheezes. She has no rales.  Lungs clear to auscultation bilaterally.  Abdominal: Soft. There is no tenderness.  Musculoskeletal: She exhibits no edema.  Lymphadenopathy:    She has no cervical adenopathy.  Neurological: She is alert. Coordination normal.  Skin: Skin is warm and dry. No rash noted. She is not diaphoretic. No erythema. No pallor.  Psychiatric: She has a normal mood and affect. Her behavior is normal.  Nursing note and vitals reviewed.    ED Treatments / Results  Labs (all labs ordered are listed, but only abnormal results are displayed) Labs Reviewed - No data to display  EKG  EKG Interpretation None       Radiology No results found.  Procedures Procedures (including critical care time)  Medications Ordered in ED Medications  gi cocktail (Maalox,Lidocaine,Donnatal) (not administered)     Initial Impression / Assessment and Plan /  ED Course  I have reviewed the triage vital signs and the nursing notes.  Pertinent labs & imaging results that were available during my care of the patient were reviewed by me and considered in my medical decision making (see chart for details).    This is a 26 y.o. Female who presents to the emergency department complaining of a foreign body sensation in her throat after eating pot so last night at 6 PM. Patient reports she was eating some posture and 6 PM last night when she felt that some pasta became stuck in her throat. She reports since then she has pain to her throat and feels's stuck there. She reports she has been drinking soda and food without relief. She's had no vomiting. No trouble with her secretions. No drooling.  On exam the patient is afebrile nontoxic appearing. Her throat is clear. No drooling. She is handling her secretions without difficulty. She is speaking in  complete sentences. She reports that she's been eating and drinking with pain to her throat, but with out vomiting or difficulty. I suspect this is a sensation of a foreign body in her throat. I reassured the patient that likely, the hospital is not stuck in her throat, but we would try a GI cocktail and by mouth trial to ensure she is able to keep down liquids. Patient agrees to this plan. I advised the back to reevaluate her after the GI cocktail. Nursing staff reports the return to the room and patient and family were not in the room. Their belongings were missing. They went out to the waiting room and were not able to find the patient. Patient eloped prior to discharge. Plan was for discharge if patient could tolerate PO in the ER.   Final Clinical Impressions(s) / ED Diagnoses   Final diagnoses:  Foreign body sensation in throat    New Prescriptions Discharge Medication List as of 04/02/2016 10:24 AM       Everlene Farrier, PA-C 04/02/16 1030    Courteney Lyn Corlis Leak, MD 04/03/16 [phone]

## 2017-11-07 ENCOUNTER — Encounter (HOSPITAL_COMMUNITY): Payer: Self-pay | Admitting: Emergency Medicine

## 2017-11-07 ENCOUNTER — Other Ambulatory Visit: Payer: Self-pay

## 2017-11-07 ENCOUNTER — Emergency Department (HOSPITAL_COMMUNITY)
Admission: EM | Admit: 2017-11-07 | Discharge: 2017-11-07 | Disposition: A | Discharge status: Home / Self Care | Payer: Self-pay | Attending: Emergency Medicine | Admitting: Emergency Medicine

## 2017-11-07 DIAGNOSIS — Z79899 Other long term (current) drug therapy: Secondary | ICD-10-CM | POA: Insufficient documentation

## 2017-11-07 DIAGNOSIS — F17210 Nicotine dependence, cigarettes, uncomplicated: Secondary | ICD-10-CM | POA: Insufficient documentation

## 2017-11-07 DIAGNOSIS — K0889 Other specified disorders of teeth and supporting structures: Secondary | ICD-10-CM

## 2017-11-07 LAB — POC URINE PREG, ED: Preg Test, Ur: NEGATIVE

## 2017-11-07 MED ORDER — PENICILLIN V POTASSIUM 500 MG PO TABS: 500.0000 mg | ORAL_TABLET | Freq: Four times a day (QID) | ORAL | 0 refills | Status: AC

## 2017-11-07 MED ORDER — CHLORHEXIDINE GLUCONATE 0.12 % MT SOLN
15.0000 mL | Freq: Two times a day (BID) | OROMUCOSAL | 0 refills | Status: DC
Start: 2017-11-07 — End: 2021-10-04

## 2017-11-07 MED ORDER — LIDOCAINE VISCOUS HCL 2 % MT SOLN: 15.0000 mL | OROMUCOSAL | 0 refills | Status: DC | PRN

## 2017-11-07 MED ORDER — PENICILLIN V POTASSIUM 250 MG PO TABS
500.0000 mg | ORAL_TABLET | Freq: Once | ORAL | Status: AC
  Administered 2017-11-07: 500 mg via ORAL
  Filled 2017-11-07: qty 2

## 2017-11-07 NOTE — ED Triage Notes (Signed)
Pt presents with sore throat and dental pain to R lower jaw and radiates to R ear x 4 days; pt state OTC pain relief not working

## 2017-11-07 NOTE — ED Notes (Signed)
Pt stable, ambulatory, states understanding of discharge instructions 

## 2017-11-07 NOTE — ED Provider Notes (Signed)
Patient placed in Quick Look pathway, seen and evaluated   Chief Complaint: sore throat  HPI: Meagan Burgess is a 27 y.o. female who presents to the ED with 4 day hx of sore throat. Patient unsure of fever. She does have gland swelling.   ROS: ENT: sore throat  Physical Exam:  BP 132/81 (BP Location: Right Arm)   Pulse 75   Temp 98.8 F (37.1 C) (Oral)   Resp 18   SpO2 99%    Gen: No distress  Neuro: Awake and Alert  Skin: Warm and dry  ENT: throat with erythema, no exudate  Neck: cervical lymph nodes slightly enlarged.    Initiation of care has begun. The patient has been counseled on the process, plan, and necessity for staying for the completion/evaluation, and the remainder of the medical screening examination    Janne Napoleon, NP 11/07/17 1642    Jacalyn Lefevre, MD 11/07/17 1652

## 2017-11-07 NOTE — Discharge Instructions (Signed)
Please see the information and instructions below regarding your visit.  Your diagnoses today include:  1. Pain, dental    You have a dental infection. It is very important that you get evaluated by a dentist as soon as possible. Call tomorrow to schedule an appointment. Ibuprofen as needed for pain. Take your full course of antibiotics. Read the instructions below.  Tests performed today include: See side panel of your discharge paperwork for testing performed today. Vital signs are listed at the bottom of these instructions.   Medications prescribed:    Take any prescribed medications only as prescribed, and any over the counter medications only as directed on the packaging.  1. You are prescribed penicillin, an antibiotic. Please take all of your antibiotics until finished.   You may develop abdominal discomfort or nausea from the antibiotic. If this occurs, you may take it with food. Some patients also get diarrhea with antibiotics. You may help offset this with probiotics which you can buy or get in yogurt. Do not eat or take the probiotics until 2 hours after your antibiotic. Some women develop vaginal yeast infections after antibiotics. If you develop unusual vaginal discharge after being on this medication, please see your primary care provider.   Some people develop allergies to antibiotics. Symptoms of antibiotic allergy can be mild and include a flat rash and itching. They can also be more serious and include:  ?Hives - Hives are raised, red patches of skin that are usually very itchy.  ?Lip or tongue swelling  ?Trouble swallowing or breathing  ?Blistering of the skin or mouth.  If you have any of these serious symptoms, please seek emergency medical care immediately.  2. You are prescribed naproxen, a non-steroidal anti-inflammatory agent (NSAID) for pain. You may take 375mg  every 12 hours as needed for pain. If still requiring this medication around the clock for acute pain  after 10 days, please see your primary healthcare provider.  Alternatively, you may take ibuprofen 400 mg every 6 hours as needed for pain.  Do not take both.  You may combine this medication with Tylenol, 650 mg every 6 hours, so you are receiving something for pain every 3 hours.  Do not exceed 4000 mg in 1 day.  This is not a long-term medication unless under the care and direction of your primary provider. Taking this medication long-term and not under the supervision of a healthcare provider could increase the risk of stomach ulcers, kidney problems, and cardiovascular problems such as high blood pressure.   Home care instructions:  Please follow any educational materials contained in this packet.   Eat a soft or liquid diet and rinse your mouth out after meals with warm water. You should see a dentist or return here at once if you have increased swelling, increased pain or uncontrolled bleeding from the site of your injury.  Follow-up instructions: It is very important that you see a dentist as soon as possible. There is a list of dentists attached to this packet if you do not have care established with a dentist already. Please give a call to a dentist of your choice tomorrow.  Return instructions:  Please return to the Emergency Department if you experience worsening symptoms.  Please seek care if you note any of the following about your dental pain:  You have increased pain not controlled with medicines.  You have swelling around your tooth, in your face or neck.  You have bleeding which starts, continues, or gets  worse.  You have a fever >101 If you are unable to open your mouth Please return if you have any other emergent concerns.  Additional Information:   Your vital signs today were: BP 132/81 (BP Location: Right Arm)    Pulse 75    Temp 98.8 F (37.1 C) (Oral)    Resp 18    Ht 5\' 2"  (1.575 m)    Wt 52.2 kg    SpO2 99%    BMI 21.03 kg/m  If your blood pressure (BP) was  elevated on multiple readings during this visit above 130 for the top number or above 80 for the bottom number, please have this repeated by your primary care provider within one month. --------------  Thank you for allowing [place] to participate in your care today.

## 2017-11-07 NOTE — ED Provider Notes (Signed)
[facility] EMERGENCY DEPARTMENT Provider Note   CSN: [id number] Arrival date & time: 11/07/17  1600     History   Chief Complaint Chief Complaint  Patient presents with  . Sore Throat    HPI Meagan Burgess is a 27 y.o. female.  HPI   Meagan Burgess is a 27 y.o. female who presents to the Emergency Department complaining of persistent, gradually worsening, right-sided, upper dental pain beginning 4 days ago. Pt describes their pain as throbbing.  She reports that it radiates to the right side of her throat, into her right ear.  Pt has been taking ibuprofen, naproxen, and Tylenol at home with minimal relief of pain. Pain is exacerbated by chewing, or any palpation of her right wisdom tooth. They are not currently followed by dentistry.  Pt denies facial swelling, fever, chills, difficulty breathing, difficulty swallowing.    Past Medical History:  Diagnosis Date  . Abnormal Pap smear    colpo- wnl  . Herpes   . Urinary tract infection     Patient Active Problem List   Diagnosis Date Noted  . Tobacco abuse 12/03/2015    Past Surgical History:  Procedure Laterality Date  . NO PAST SURGERIES       OB History    Gravida  3   Para  3   Term  3   Preterm  0   AB  0   Living  3     SAB  0   TAB  0   Ectopic  0   Multiple  0   Live Births  3            Home Medications    Prior to Admission medications   Medication Sig Start Date End Date Taking? Authorizing Provider  clotrimazole (LOTRIMIN) 1 % cream Apply 1 application topically 2 (two) times daily. Patient taking differently: Apply 1 application topically as needed (for rash).  03/07/16   Brock Bad, MD  medroxyPROGESTERone (DEPO-PROVERA) 150 MG/ML injection Inject 1 mL (150 mg total) into the muscle every 3 (three) months. 12/03/15   [place] Bing, MD  medroxyPROGESTERone (PROVERA) 10 MG tablet Take 1 tablet (10 mg total) by mouth daily. Patient not  taking: Reported on 04/02/2016 03/07/16   Brock Bad, MD  Multiple Vitamins-Minerals (MULTIVITAMIN PO) Take 1 tablet by mouth 2 (two) times a week.    [provider]  Prenatal Vit-Fe Fumarate-FA (PRENATAL VITAMIN) 27-0.8 MG TABS Take 1 tablet by mouth daily. Patient not taking: Reported on 04/02/2016 12/03/15   [place] Bing, MD  tinidazole [facility]) 500 MG tablet Take 2 tablets (1,000 mg total) by mouth daily with breakfast. Patient not taking: Reported on 04/02/2016 03/15/16   Brock Bad, MD    Family History Family History  Problem Relation Age of Onset  . Hypertension Father   . Hypertension Maternal Grandmother   . Cancer Maternal Grandfather   . Diabetes Paternal Grandmother   . Anesthesia problems Neg Hx     Social History Social History   Tobacco Use  . Smoking status: Current Every Day Smoker    Packs/day: 0.25    Years: 6.00    Pack years: 1.50    Types: Cigarettes    Last attempt to quit: 04/26/2011    Years since quitting: 6.5  . Smokeless tobacco: Never Used  Substance Use Topics  . Alcohol use: No  . Drug use: No     Allergies  Patient has no known allergies.   Review of Systems Review of Systems  Constitutional: Negative for chills and fever.  HENT: Positive for dental problem and sore throat. Negative for trouble swallowing and voice change.   Respiratory: Negative for chest tightness and stridor.   Gastrointestinal: Negative for nausea and vomiting.     Physical Exam Updated Vital Signs BP 132/81 (BP Location: Right Arm)   Pulse 75   Temp 98.8 F (37.1 C) (Oral)   Resp 18   Ht 5\' 2"  (1.575 m)   Wt 52.2 kg   SpO2 99%   BMI 21.03 kg/m   Physical Exam  Constitutional: She appears well-developed and well-nourished. No distress.  Sitting comfortably in bed.  HENT:  Head: Normocephalic and atraumatic.  Mouth/Throat: Tonsils are 1+ on the right. Tonsils are 1+ on the left.  Dental cavities and poor oral dentition  noted. Pain along tooth as depicted in image. No abscess noted. Midline uvula. No trismus. OP clear and moist. No oropharyngeal erythema or edema. Neck supple with no tenderness. No facial edema.  No tonsillar exudate.  No tonsillar hypertrophy.  No erythema of posterior pharynx.  Eyes: Conjunctivae are normal. Right eye exhibits no discharge. Left eye exhibits no discharge.  EOMs normal to gross examination.  Neck: Normal range of motion.  Cardiovascular: Normal rate and regular rhythm.  Intact, 2+ radial pulse.  Pulmonary/Chest:  Normal respiratory effort. Patient converses comfortably. No audible wheeze or stridor.  Abdominal: She exhibits no distension.  Musculoskeletal: Normal range of motion.  Neurological: She is alert.  Cranial nerves intact to gross observation. Patient moves extremities without difficulty.  Skin: Skin is warm and dry. She is not diaphoretic.  Psychiatric: She has a normal mood and affect. Her behavior is normal. Judgment and thought content normal.  Nursing note and vitals reviewed.    ED Treatments / Results  Labs (all labs ordered are listed, but only abnormal results are displayed) Labs Reviewed  POC URINE PREG, ED    EKG None  Radiology No results found.  Procedures Procedures (including critical care time)  Medications Ordered in ED Medications - No data to display   Initial Impression / Assessment and Plan / ED Course  I have reviewed the triage vital signs and the nursing notes.  Pertinent labs & imaging results that were available during my care of the patient were reviewed by me and considered in my medical decision making (see chart for details).     Meagan Burgess is a 27 y.o. female who presents to ED for dental pain. No abscess requiring immediate incision and drainage. Patient is afebrile, non toxic appearing, and swallowing secretions well. Exam not concerning for Ludwig's angina or pharyngeal abscess. Will treat with  Penicillin, viscous lidocaine, and chlorhexidine.  Noninfectious appearance of throat.  No trismus.  Patient is tolerating p.o. without difficulty in the emergency department.  I provided dental resource guide and stressed the importance of dental follow up for ultimate management of dental pain. Patient voices understanding and is agreeable to plan.   Final Clinical Impressions(s) / ED Diagnoses   Final diagnoses:  Pain, dental    ED Discharge Orders         Ordered    penicillin v potassium (VEETID) 500 MG tablet  4 times daily     11/07/17 1926    chlorhexidine (PERIDEX) 0.12 % solution  2 times daily     11/07/17 1926    lidocaine (XYLOCAINE) 2 % solution  As needed     11/07/17 1926           Delia Chimes 11/07/17 Doreatha Massed, MD 11/13/17 [phone]

## 2017-11-07 NOTE — ED Notes (Signed)
Strep swab collected in triage.

## 2018-10-22 ENCOUNTER — Other Ambulatory Visit: Payer: Self-pay | Admitting: Family Medicine

## 2018-10-22 DIAGNOSIS — Z20822 Contact with and (suspected) exposure to covid-19: Secondary | ICD-10-CM

## 2018-10-23 ENCOUNTER — Other Ambulatory Visit: Payer: Self-pay

## 2018-10-23 DIAGNOSIS — Z20822 Contact with and (suspected) exposure to covid-19: Secondary | ICD-10-CM

## 2018-10-24 LAB — NOVEL CORONAVIRUS, NAA: SARS-CoV-2, NAA: NOT DETECTED

## 2018-11-30 ENCOUNTER — Telehealth: Payer: Medicaid Other | Admitting: Family

## 2018-11-30 DIAGNOSIS — B9689 Other specified bacterial agents as the cause of diseases classified elsewhere: Secondary | ICD-10-CM

## 2018-11-30 DIAGNOSIS — J028 Acute pharyngitis due to other specified organisms: Secondary | ICD-10-CM

## 2018-11-30 MED ORDER — AMOXICILLIN 500 MG PO CAPS: 500.0000 mg | ORAL_CAPSULE | Freq: Two times a day (BID) | ORAL | 0 refills | Status: DC

## 2018-11-30 NOTE — Progress Notes (Signed)
We are sorry that you are not feeling well.  Here is how we plan to help!  Based on what you have shared with me it is likely that you have strep pharyngitis.  Strep pharyngitis is inflammation and infection in the back of the throat.  This is an infection cause by bacteria and is treated with antibiotics.  I have prescribed Amoxicillin 500 mg twice a day for 10 days. For throat pain, we recommend over the counter oral pain relief medications such as acetaminophen or aspirin, or anti-inflammatory medications such as ibuprofen or naproxen sodium. Topical treatments such as oral throat lozenges or sprays may be used as needed. Strep infections are not as easily transmitted as other respiratory infections, however we still recommend that you avoid close contact with loved ones, especially the very young and elderly.  Remember to wash your hands thoroughly throughout the day as this is the number one way to prevent the spread of infection and wipe down door knobs and counters with disinfectant.   Home Care:  Only take medications as instructed by your medical team.  Complete the entire course of an antibiotic.  Do not take these medications with alcohol.  A steam or ultrasonic humidifier can help congestion.  You can place a towel over your head and breathe in the steam from hot water coming from a faucet.  Avoid close contacts especially the very young and the elderly.  Cover your mouth when you cough or sneeze.  Always remember to wash your hands.  Get Help Right Away If:  You develop worsening fever or sinus pain.  You develop a severe head ache or visual changes.  Your symptoms persist after you have completed your treatment plan.  Make sure you  Understand these instructions.  Will watch your condition.  Will get help right away if you are not doing well or get worse.  Your e-visit answers were reviewed by a board certified advanced clinical practitioner to complete your  personal care plan.  Depending on the condition, your plan could have included both over the counter or prescription medications.  If there is a problem please reply  once you have received a response from your provider.  Your safety is important to us.  If you have drug allergies check your prescription carefully.    You can use MyChart to ask questions about today's visit, request a non-urgent call back, or ask for a work or school excuse for 24 hours related to this e-Visit. If it has been greater than 24 hours you will need to follow up with your provider, or enter a new e-Visit to address those concerns.  You will get an e-mail in the next two days asking about your experience.  I hope that your e-visit has been valuable and will speed your recovery. Thank you for using e-visits.  Greater than 5 minutes, yet less than 10 minutes of time have been spent researching, coordinating, and implementing care for this patient today.  Thank you for the details you included in the comment boxes. Those details are very helpful in determining the best course of treatment for you and help us to provide the best care.  

## 2019-01-07 ENCOUNTER — Telehealth: Payer: Medicaid Other | Admitting: Physician Assistant

## 2019-01-07 DIAGNOSIS — K047 Periapical abscess without sinus: Secondary | ICD-10-CM

## 2019-01-07 MED ORDER — AMOXICILLIN-POT CLAVULANATE 875-125 MG PO TABS: 1.0000 | ORAL_TABLET | Freq: Two times a day (BID) | ORAL | 0 refills | Status: AC

## 2019-01-07 NOTE — Progress Notes (Signed)
E Visit for Dental Abscess  I am prescribing you an antibiotic for this problem.  You must take this twice daily x 2 weeks and do not miss a dose.  You need to schedule an appointment with a dentist. The abscess may need to be drained or it could potentially return.  Please make this problem a priority as infections of your mouth can spread to other areas of your face and possibly make you very sick.    I have prescribed: Augmentin 875 mg twice daily for 14 days.   If this condition does not start to improve in 24-48 hours after starting the medication or if your condition worsens, go to the emergency department.   Please consider finding a dentist for regular care of your teeth.  This is key for the prevention and control of dental caries and advanced infections of your mouth.  [email or web address]  HOME CARE:  . Take your medications as ordered and take all of them, even if the skin irritation appears to be healing.   The key for the prevention and control of dental caries and advanced infections of your mouth is the active promotion of oral hygiene. The components of such a regimen include: ?Regular brushing with a fluoridated toothpaste and dental flossing after each meal ?Dietary counseling to reduce the ingestion of sugar-rich foods or beverages ?Use of topical fluorides and oral antimicrobial rinses, such as chlorhexidine for high-risk patients ?Modification of risk factors, such as smoking cessation ?Overcoming the reluctance for regular visits to dental professionals  GET HELP RIGHT AWAY IF:  . Symptoms that don't begin to go away within 48 hours. . Severe redness persists or worsens . If the area turns color, spreads or swells. . If it blisters and opens, develops yellow-brown crust or bleeds. . You develop a fever or chills. . If the pain increases or becomes unbearable.  . Are unable to keep fluids and food down.  MAKE SURE  YOU    Understand these instructions.  Will watch your condition.  Will get help right away if you are not doing well or get worse.  Thank you for choosing an e-visit. Your e-visit answers were reviewed by a board certified advanced clinical practitioner to complete your personal care plan. Depending upon the condition, your plan could have included both over the counter or prescription medications. Please review your pharmacy choice. Make sure the pharmacy is open so you can pick up prescription now. If there is a problem, you may contact your provider through [employer] and have the prescription routed to another pharmacy. Your safety is important to [place]. If you have drug allergies check your prescription carefully.  For the next 24 hours you can use MyChart to ask questions about today's visit, request a non-urgent call back, or ask for a work or school excuse. You will get an email in the next two days asking about your experience. I hope that your e-visit has been valuable and will speed your recovery.  Greater than 5 minutes, yet less than 10 minutes of time have been spent researching, coordinating and implementing care for this patient today.

## 2019-07-01 ENCOUNTER — Encounter (HOSPITAL_COMMUNITY): Payer: Self-pay | Admitting: Emergency Medicine

## 2019-07-01 ENCOUNTER — Emergency Department (HOSPITAL_COMMUNITY)
Admission: EM | Admit: 2019-07-01 | Discharge: 2019-07-01 | Disposition: A | Discharge status: Home / Self Care | Payer: Medicaid Other | Attending: Emergency Medicine | Admitting: Emergency Medicine

## 2019-07-01 ENCOUNTER — Emergency Department (HOSPITAL_COMMUNITY): Payer: Medicaid Other

## 2019-07-01 ENCOUNTER — Other Ambulatory Visit: Payer: Self-pay

## 2019-07-01 DIAGNOSIS — N12 Tubulo-interstitial nephritis, not specified as acute or chronic: Secondary | ICD-10-CM | POA: Diagnosis not present

## 2019-07-01 DIAGNOSIS — M545 Low back pain: Secondary | ICD-10-CM | POA: Insufficient documentation

## 2019-07-01 DIAGNOSIS — F17210 Nicotine dependence, cigarettes, uncomplicated: Secondary | ICD-10-CM | POA: Insufficient documentation

## 2019-07-01 DIAGNOSIS — R509 Fever, unspecified: Secondary | ICD-10-CM | POA: Diagnosis present

## 2019-07-01 LAB — I-STAT BETA HCG BLOOD, ED (MC, WL, AP ONLY): I-stat hCG, quantitative: 10.7 m[IU]/mL — ABNORMAL HIGH (ref <5)

## 2019-07-01 LAB — CBC WITH DIFFERENTIAL/PLATELET
Abs Immature Granulocytes: 0.08 10*3/uL — ABNORMAL HIGH (ref 0.00–0.07)
Basophils Absolute: 0 10*3/uL (ref 0.0–0.1)
Basophils Relative: 0 %
Eosinophils Absolute: 0 10*3/uL (ref 0.0–0.5)
Eosinophils Relative: 0 %
HCT: 40.8 % (ref 36.0–46.0)
Hemoglobin: 13.6 g/dL (ref 12.0–15.0)
Immature Granulocytes: 1 %
Lymphocytes Relative: 5 %
Lymphs Abs: 0.8 10*3/uL (ref 0.7–4.0)
MCH: 31.8 pg (ref 26.0–34.0)
MCHC: 33.3 g/dL (ref 30.0–36.0)
MCV: 95.3 fL (ref 80.0–100.0)
Monocytes Absolute: 1.2 10*3/uL — ABNORMAL HIGH (ref 0.1–1.0)
Monocytes Relative: 7 %
Neutro Abs: 15.2 10*3/uL — ABNORMAL HIGH (ref 1.7–7.7)
Neutrophils Relative %: 87 %
Platelets: 189 10*3/uL (ref 150–400)
RBC: 4.28 MIL/uL (ref 3.87–5.11)
RDW: 11.9 % (ref 11.5–15.5)
WBC: 17.3 10*3/uL — ABNORMAL HIGH (ref 4.0–10.5)
nRBC: 0 % (ref 0.0–0.2)

## 2019-07-01 LAB — COMPREHENSIVE METABOLIC PANEL
ALT: 21 U/L (ref 0–44)
AST: 22 U/L (ref 15–41)
Albumin: 3.8 g/dL (ref 3.5–5.0)
Alkaline Phosphatase: 76 U/L (ref 38–126)
Anion gap: 14 (ref 5–15)
BUN: 6 mg/dL (ref 6–20)
CO2: 20 mmol/L — ABNORMAL LOW (ref 22–32)
Calcium: 9 mg/dL (ref 8.9–10.3)
Chloride: 102 mmol/L (ref 98–111)
Creatinine, Ser: 0.87 mg/dL (ref 0.44–1.00)
GFR calc Af Amer: >60 mL/min (ref >60)
GFR calc non Af Amer: >60 mL/min (ref >60)
Glucose, Bld: 115 mg/dL — ABNORMAL HIGH (ref 70–99)
Potassium: 3.4 mmol/L — ABNORMAL LOW (ref 3.5–5.1)
Sodium: 136 mmol/L (ref 135–145)
Total Bilirubin: 1 mg/dL (ref 0.3–1.2)
Total Protein: 7 g/dL (ref 6.5–8.1)

## 2019-07-01 LAB — URINALYSIS, ROUTINE W REFLEX MICROSCOPIC
Bilirubin Urine: NEGATIVE
Glucose, UA: NEGATIVE mg/dL
Ketones, ur: 20 mg/dL — AB
Nitrite: NEGATIVE
Protein, ur: 100 mg/dL — AB
Specific Gravity, Urine: 1.018 (ref 1.005–1.030)
WBC, UA: >50 WBC/hpf — ABNORMAL HIGH (ref 0–5)
pH: 5 (ref 5.0–8.0)

## 2019-07-01 LAB — LACTIC ACID, PLASMA
Lactic Acid, Venous: 1.2 mmol/L (ref 0.5–1.9)
Lactic Acid, Venous: 2 mmol/L (ref 0.5–1.9)
Lactic Acid, Venous: 1.4 mmol/L (ref 0.5–1.9)

## 2019-07-01 LAB — PREGNANCY, URINE: Preg Test, Ur: NEGATIVE

## 2019-07-01 MED ORDER — SODIUM CHLORIDE 0.9 % IV SOLN
1.0000 g | Freq: Once | INTRAVENOUS | Status: AC
  Administered 2019-07-01: 1 g via INTRAVENOUS
  Filled 2019-07-01: qty 10

## 2019-07-01 MED ORDER — SODIUM CHLORIDE 0.9 % IV BOLUS
1000.0000 mL | Freq: Once | INTRAVENOUS | Status: AC
  Administered 2019-07-01: 1000 mL via INTRAVENOUS

## 2019-07-01 MED ORDER — ONDANSETRON 4 MG PO TBDP
4.0000 mg | ORAL_TABLET | Freq: Three times a day (TID) | ORAL | 0 refills | Status: DC | PRN
Start: 2019-07-01 — End: 2021-10-04

## 2019-07-01 MED ORDER — IOHEXOL 300 MG/ML  SOLN
100.0000 mL | Freq: Once | INTRAMUSCULAR | Status: AC | PRN
  Administered 2019-07-01: 100 mL via INTRAVENOUS

## 2019-07-01 MED ORDER — ACETAMINOPHEN 500 MG PO TABS
1000.0000 mg | ORAL_TABLET | Freq: Once | ORAL | Status: AC
  Administered 2019-07-01: 1000 mg via ORAL
  Filled 2019-07-01: qty 2

## 2019-07-01 MED ORDER — SULFAMETHOXAZOLE-TRIMETHOPRIM 800-160 MG PO TABS
1.0000 | ORAL_TABLET | Freq: Two times a day (BID) | ORAL | 0 refills | Status: DC
Start: 2019-07-01 — End: 2019-07-06

## 2019-07-01 MED ORDER — OXYCODONE-ACETAMINOPHEN 5-325 MG PO TABS: 1.0000 | ORAL_TABLET | Freq: Four times a day (QID) | ORAL | 0 refills | Status: DC | PRN

## 2019-07-01 NOTE — ED Notes (Signed)
This RN spoke with [place] in the lab who stated she would add on a urine pregnancy test to the urine sent down for urinalysis.

## 2019-07-01 NOTE — ED Provider Notes (Signed)
Emergency Department Provider Note   I have reviewed the triage vital signs and the nursing notes.   HISTORY  Chief Complaint Urinary Tract Infection   HPI Meagan Burgess is a 29 y.o. female with past medical history reviewed below presents to the emergency department with several weeks of foul-smelling urine but in the last 24 hours has developed fever and lower back pain.  Her back pain is severe and worse in the right\flank area.  She reports that pain is similar to childbirth.  She describes more constant type pain rather than intermittent severe discomfort.  She denies any vaginal bleeding or discharge.  No cough or shortness of breath.  No sore throat.  She denies any concern for sexually transmitted infection.  She has no prior history of kidney stone. No recent abx.    Past Medical History:  Diagnosis Date  . Abnormal Pap smear    colpo- wnl  . Herpes   . Urinary tract infection     Patient Active Problem List   Diagnosis Date Noted  . Tobacco abuse 12/03/2015    Past Surgical History:  Procedure Laterality Date  . NO PAST SURGERIES      Allergies Patient has no known allergies.  Family History  Problem Relation Age of Onset  . Hypertension Father   . Hypertension Maternal Grandmother   . Cancer Maternal Grandfather   . Diabetes Paternal Grandmother   . Anesthesia problems Neg Hx     Social History Social History   Tobacco Use  . Smoking status: Current Every Day Smoker    Packs/day: 0.25    Years: 6.00    Pack years: 1.50    Types: Cigarettes    Last attempt to quit: 04/26/2011    Years since quitting: 8.1  . Smokeless tobacco: Never Used  Substance Use Topics  . Alcohol use: No  . Drug use: No    Review of Systems  Constitutional: Positive fever/chills Eyes: No visual changes. ENT: No sore throat. Cardiovascular: Denies chest pain. Respiratory: Denies shortness of breath. Gastrointestinal: No abdominal pain.  No nausea, no  vomiting.  No diarrhea.  No constipation. Genitourinary: Positive for dysuria and frequency.  Musculoskeletal: Positive for back pain worse on the right.  Skin: Negative for rash. Neurological: Negative for headaches, focal weakness or numbness.  10-point ROS otherwise negative.  ____________________________________________   PHYSICAL EXAM:  VITAL SIGNS: ED Triage Vitals [07/01/19 1145]  Enc Vitals Group     BP 132/74     Pulse Rate (!) 113     Resp 18     Temp (!) 100.5 F (38.1 C)     Temp Source Oral     SpO2 97 %   Constitutional: Alert and oriented. Well appearing and in no acute distress. Eyes: Conjunctivae are normal.  Head: Atraumatic. Nose: No congestion/rhinnorhea. Mouth/Throat: Mucous membranes are moist.  Neck: No stridor.   Cardiovascular: Tachycardia. Good peripheral circulation. Grossly normal heart sounds.   Respiratory: Normal respiratory effort.  No retractions. Lungs CTAB. Gastrointestinal: Soft and nontender. No distention. Positive right CVA tenderness.  Musculoskeletal: No gross deformities of extremities. Neurologic:  Normal speech and language.  Skin:  Skin is warm, dry and intact. No rash noted.  ____________________________________________   LABS (all labs ordered are listed, but only abnormal results are displayed)  Labs Reviewed  LACTIC ACID, PLASMA - Abnormal; Notable for the following components:      Result Value   Lactic Acid, Venous 2.0 (*)  All other components within normal limits  COMPREHENSIVE METABOLIC PANEL - Abnormal; Notable for the following components:   Potassium 3.4 (*)    CO2 20 (*)    Glucose, Bld 115 (*)    All other components within normal limits  CBC WITH DIFFERENTIAL/PLATELET - Abnormal; Notable for the following components:   WBC 17.3 (*)    Neutro Abs 15.2 (*)    Monocytes Absolute 1.2 (*)    Abs Immature Granulocytes 0.08 (*)    All other components within normal limits  URINALYSIS, ROUTINE W REFLEX  MICROSCOPIC - Abnormal; Notable for the following components:   APPearance HAZY (*)    Hgb urine dipstick SMALL (*)    Ketones, ur 20 (*)    Protein, ur 100 (*)    Leukocytes,Ua MODERATE (*)    WBC, UA >50 (*)    Bacteria, UA RARE (*)    All other components within normal limits  I-STAT BETA HCG BLOOD, ED ([facility], [facility], [facility] ONLY) - Abnormal; Notable for the following components:   I-stat hCG, quantitative 10.7 (*)    All other components within normal limits  URINE CULTURE  CULTURE, BLOOD (ROUTINE X 2)  CULTURE, BLOOD (ROUTINE X 2)  LACTIC ACID, PLASMA  PREGNANCY, URINE  LACTIC ACID, PLASMA   ____________________________________________  RADIOLOGY  CT ABDOMEN PELVIS W CONTRAST  Result Date: 07/01/2019 CLINICAL DATA:  Abdominal pain, fever EXAM: CT ABDOMEN AND PELVIS WITH CONTRAST TECHNIQUE: Multidetector CT imaging of the abdomen and pelvis was performed using the standard protocol following bolus administration of intravenous contrast. CONTRAST:  OMNIPAQUE IOHEXOL 300 MG/ML  SOLN COMPARISON:  None. FINDINGS: Lower chest: Lung bases are clear. No effusions. Heart is normal size. Hepatobiliary: No focal hepatic abnormality. Gallbladder unremarkable. Pancreas: No focal abnormality or ductal dilatation. Spleen: No focal abnormality.  Normal size. Adrenals/Urinary Tract: Areas of decreased enhancement noted throughout the right kidney compatible with pyelonephritis. No hydronephrosis or stones. Adrenal glands, left kidney and urinary bladder unremarkable. Stomach/Bowel: Stomach, large and small bowel grossly unremarkable. Appendix is normal. Vascular/Lymphatic: No evidence of aneurysm or adenopathy. Reproductive: Uterus and adnexa unremarkable.  No mass. Other: Small amount of free fluid in the pelvis.  No free air. Musculoskeletal: No acute bony abnormality. IMPRESSION: Areas of poor enhancement in the right kidney with striated nephrogram compatible with pyelonephritis. Electronically Signed    By: Charlett Nose M.D.   On: 07/01/2019 18:17    ____________________________________________   PROCEDURES  Procedure(s) performed:   Procedures  None  ____________________________________________   INITIAL IMPRESSION / ASSESSMENT AND PLAN / ED COURSE  Pertinent labs & imaging results that were available during my care of the patient were reviewed by me and considered in my medical decision making (see chart for details).   Patient presents to the emergency department with fever, dysuria, back and flank discomfort.  She appears overall comfortable with initial temp here of 100.5.  No tachycardia.  Lower suspicion for sepsis especially in the setting of normal lactate.  Patient does have leukocytosis of 17.  Her right flank pain is severe at times and will need CT imaging to further rule out nephrolithiasis versus abscess but suspect pyelonephritis clinically.  I-STAT hCG came back slightly elevated at 10.  Suspect lab error and will send urine pregnancy test prior to CT.   06:30 PM  CT abdomen pelvis consistent with developing pyelonephritis.  No evidence of abscess or kidney stone.  Patient is feeling better.  IV fluids ordered with lactate now  at 2.0.  Vital signs are otherwise normal. Doubt sepsis. Plan for PO challenge and reassess with likely plan for discharge.   08:24 PM  Lactate is normal after IV fluids.  Patient is drinking fluids here and tolerating PO without vomiting.  Will discharge home on Bactrim for 2 weeks with developing pyelonephritis along with Zofran as needed for nausea vomiting.  Discussed strict ED return precautions.  ____________________________________________  FINAL CLINICAL IMPRESSION(S) / ED DIAGNOSES  Final diagnoses:  Pyelonephritis     MEDICATIONS GIVEN DURING THIS VISIT:  Medications  cefTRIAXone (ROCEPHIN) 1 g in sodium chloride 0.9 % 100 mL IVPB (0 g Intravenous Stopped 07/01/19 1909)  iohexol (OMNIPAQUE) 300 MG/ML solution 100 mL (100 mLs  Intravenous Contrast Given 07/01/19 1755)  sodium chloride 0.9 % bolus 1,000 mL (1,000 mLs Intravenous Bolus from Bag 07/01/19 1836)  acetaminophen (TYLENOL) tablet 1,000 mg (1,000 mg Oral Given 07/01/19 1841)    Note:  This document was prepared using Dragon voice recognition software and may include unintentional dictation errors.  Alona Bene, MD, [facility]    Kit Mollett, Arlyss Repress, MD 07/01/19 2025

## 2019-07-01 NOTE — Discharge Instructions (Signed)
You were seen in the emergency room today with a urine infection which appears to be spreading to your kidneys.  We have given you IV antibiotics and are sending you home on 2 weeks of antibiotics along with some nausea medicine.  This condition can get worse and occasionally requires hospitalization.  If you get worse despite being on antibiotics or begin vomiting and cannot take your antibiotics by mouth please return to the emergency department immediately for reevaluation.  Follow closely with your primary care doctor.  I have listed a primary care physician on his form for you if you do not already have one.

## 2019-07-01 NOTE — ED Triage Notes (Signed)
Pt reports dark, foul smelling urine with urinary frequency for the past few weeks, now has some lower back pain and high fever at home. Took 1g tylenol pta.

## 2019-07-02 ENCOUNTER — Telehealth (HOSPITAL_BASED_OUTPATIENT_CLINIC_OR_DEPARTMENT_OTHER): Payer: Self-pay | Admitting: Emergency Medicine

## 2019-07-02 LAB — BLOOD CULTURE ID PANEL (REFLEXED)

## 2019-07-02 NOTE — Telephone Encounter (Signed)
Received call from [facility] with positive blood culture. Consulted with Dr. Adela Lank who advised pt was prescribed antibiotics and treatment was appropriate. No further action needed.

## 2019-07-03 LAB — URINE CULTURE: Culture: >=100000 — AB

## 2019-07-04 LAB — CULTURE, BLOOD (ROUTINE X 2): Special Requests: ADEQUATE

## 2019-07-05 ENCOUNTER — Telehealth: Payer: Self-pay | Admitting: *Deleted

## 2019-07-05 NOTE — Telephone Encounter (Signed)
Post ED Visit - Positive Culture Follow-up: Chart Hand-off to ED Flow Manager  Culture assessed and recommendations reviewed by: []  , Pharm.D., BCPS []  Isaac Bliss, Pharm.D., BCPS-AQ ID []  , Pharm.D., BCPS []  [place], .D., BCPS, AAHIVP []  Georgina Pillion, Pharm .D., BCPS, AAHIVP []  Cassie Stewart, Pharm.D. []  , Pharm.D.  Positive blood culture, reviewed by [place] PA-C  []  Patient discharged without antimicrobial prescription and treatment is now indicated []  Organism is resistant to prescribed ED discharge antimicrobial [x]  Patient with positive blood cultures  Contacted patient via phone who states that UTI symptoms have improved.  Now with headache and lightheadedness with bending over.  Advised to continue Bactrim as prescribed and return to ED for further evaluation if symptoms return.      [address] [facility] 07/05/2019, 11:59 AM

## 2019-07-05 NOTE — Progress Notes (Signed)
ED Antimicrobial Stewardship Positive Culture Follow Up   Meagan Burgess is an 29 y.o. female who presented to [facility] on 07/01/2019 with a chief complaint of  Chief Complaint  Patient presents with   Urinary Tract Infection    Recent Results (from the past 720 hour(s))  Urine culture     Status: Abnormal   Collection Time: 07/01/19 11:54 AM   Specimen: Urine, Random  Result Value Ref Range Status   Specimen Description URINE, RANDOM  Final   Special Requests   Final    NONE Performed at [facility], [address] [place], [place] [address]    Culture >=100,000 COLONIES/mL ESCHERICHIA COLI (A)  Final   Report Status 07/03/2019 FINAL  Final   Organism ID, Bacteria ESCHERICHIA COLI (A)  Final      Susceptibility   Escherichia coli - MIC*    AMPICILLIN <=2 SENSITIVE Sensitive     CEFAZOLIN <=4 SENSITIVE Sensitive     CEFTRIAXONE <=0.25 SENSITIVE Sensitive     CIPROFLOXACIN <=0.25 SENSITIVE Sensitive     GENTAMICIN <=1 SENSITIVE Sensitive     IMIPENEM <=0.25 SENSITIVE Sensitive     NITROFURANTOIN <=16 SENSITIVE Sensitive     TRIMETH/SULFA <=20 SENSITIVE Sensitive     AMPICILLIN/SULBACTAM <=2 SENSITIVE Sensitive     PIP/TAZO <=4 SENSITIVE Sensitive     * >=100,000 COLONIES/mL ESCHERICHIA COLI  Culture, blood (routine x 2)     Status: None (Preliminary result)   Collection Time: 07/01/19  5:24 PM   Specimen: BLOOD  Result Value Ref Range Status   Specimen Description BLOOD RIGHT ANTECUBITAL  Final   Special Requests   Final    BOTTLES DRAWN AEROBIC AND ANAEROBIC Blood Culture adequate volume   Culture   Final    NO GROWTH 4 DAYS Performed at [facility], [address] [place], [place] [address]    Report Status PENDING  Incomplete  Culture, blood (routine x 2)     Status: Abnormal   Collection Time: 07/01/19  5:34 PM   Specimen: BLOOD  Result Value Ref Range Status   Specimen Description BLOOD LEFT ANTECUBITAL  Final   Special Requests   Final     BOTTLES DRAWN AEROBIC AND ANAEROBIC Blood Culture adequate volume   Culture  Setup Time   Final    AEROBIC BOTTLE ONLY GRAM NEGATIVE RODS CRITICAL RESULT CALLED TO, READ BACK BY AND VERIFIED WITH: Laren Boom RN 07/02/19 2030 JDW Performed at [facility], [address], [place], [place] [address]    Culture ESCHERICHIA COLI (A)  Final   Report Status 07/04/2019 FINAL  Final   Organism ID, Bacteria ESCHERICHIA COLI  Final      Susceptibility   Escherichia coli - MIC*    AMPICILLIN <=2 SENSITIVE Sensitive     CEFAZOLIN <=4 SENSITIVE Sensitive     CEFEPIME <=0.12 SENSITIVE Sensitive     CEFTAZIDIME <=1 SENSITIVE Sensitive     CEFTRIAXONE <=0.25 SENSITIVE Sensitive     CIPROFLOXACIN <=0.25 SENSITIVE Sensitive     GENTAMICIN <=1 SENSITIVE Sensitive     IMIPENEM <=0.25 SENSITIVE Sensitive     TRIMETH/SULFA <=20 SENSITIVE Sensitive     AMPICILLIN/SULBACTAM <=2 SENSITIVE Sensitive     PIP/TAZO <=4 SENSITIVE Sensitive     * ESCHERICHIA COLI  Blood Culture ID Panel (Reflexed)     Status: Abnormal   Collection Time: 07/01/19  5:34 PM  Result Value Ref Range Status   Enterococcus  species NOT DETECTED NOT DETECTED Final   Listeria monocytogenes NOT DETECTED NOT DETECTED Final   Staphylococcus species NOT DETECTED NOT DETECTED Final   Staphylococcus aureus (BCID) NOT DETECTED NOT DETECTED Final   Streptococcus species NOT DETECTED NOT DETECTED Final   Streptococcus agalactiae NOT DETECTED NOT DETECTED Final   Streptococcus pneumoniae NOT DETECTED NOT DETECTED Final   Streptococcus pyogenes NOT DETECTED NOT DETECTED Final   Acinetobacter baumannii NOT DETECTED NOT DETECTED Final   Enterobacteriaceae species DETECTED (A) NOT DETECTED Final    Comment: Enterobacteriaceae represent a large family of gram-negative bacteria, not a single organism. CRITICAL RESULT CALLED TO, READ BACK BY AND VERIFIED WITH: K NEAL RN 07/02/19 2030 JDW    Enterobacter cloacae complex NOT DETECTED NOT DETECTED  Final   Escherichia coli DETECTED (A) NOT DETECTED Final    Comment: CRITICAL RESULT CALLED TO, READ BACK BY AND VERIFIED WITH: K NEAL RN 07/02/19 2030 JDW    Klebsiella oxytoca NOT DETECTED NOT DETECTED Final   Klebsiella pneumoniae NOT DETECTED NOT DETECTED Final   Proteus species NOT DETECTED NOT DETECTED Final   Serratia marcescens NOT DETECTED NOT DETECTED Final   Carbapenem resistance NOT DETECTED NOT DETECTED Final   Haemophilus influenzae NOT DETECTED NOT DETECTED Final   Neisseria meningitidis NOT DETECTED NOT DETECTED Final   Pseudomonas aeruginosa NOT DETECTED NOT DETECTED Final   Candida albicans NOT DETECTED NOT DETECTED Final   Candida glabrata NOT DETECTED NOT DETECTED Final   Candida krusei NOT DETECTED NOT DETECTED Final   Candida parapsilosis NOT DETECTED NOT DETECTED Final   Candida tropicalis NOT DETECTED NOT DETECTED Final    Comment: Performed at [facility], [address], [place], [place] [address]    [x]  Patient discharged originally with antimicrobial agent Bactrim  New antibiotic prescription: Flow Manager to call patient for symptom check. If patient feeling better, continue current Bactrim prescription until full course completed. If patient having further symptoms (fever, low back pain, flank pain, urinary symptoms, etc.), then have patient return to the ER for IV antibiotics.  ED Provider: , PA-C   Berle Mull Dohlen 07/05/2019, 7:54 AM Clinical Pharmacist Monday - Friday phone -  [phone] Saturday - Sunday phone - [phone]

## 2019-07-06 ENCOUNTER — Emergency Department (HOSPITAL_COMMUNITY): Payer: Medicaid Other

## 2019-07-06 ENCOUNTER — Emergency Department (HOSPITAL_COMMUNITY)
Admission: EM | Admit: 2019-07-06 | Discharge: 2019-07-06 | Disposition: A | Discharge status: Home / Self Care | Payer: Medicaid Other | Attending: Emergency Medicine | Admitting: Emergency Medicine

## 2019-07-06 ENCOUNTER — Encounter (HOSPITAL_COMMUNITY): Payer: Self-pay | Admitting: Emergency Medicine

## 2019-07-06 DIAGNOSIS — R42 Dizziness and giddiness: Secondary | ICD-10-CM | POA: Insufficient documentation

## 2019-07-06 DIAGNOSIS — E876 Hypokalemia: Secondary | ICD-10-CM

## 2019-07-06 DIAGNOSIS — R519 Headache, unspecified: Secondary | ICD-10-CM | POA: Diagnosis present

## 2019-07-06 DIAGNOSIS — M791 Myalgia, unspecified site: Secondary | ICD-10-CM | POA: Insufficient documentation

## 2019-07-06 DIAGNOSIS — F17210 Nicotine dependence, cigarettes, uncomplicated: Secondary | ICD-10-CM | POA: Diagnosis not present

## 2019-07-06 DIAGNOSIS — R109 Unspecified abdominal pain: Secondary | ICD-10-CM | POA: Insufficient documentation

## 2019-07-06 DIAGNOSIS — Z79899 Other long term (current) drug therapy: Secondary | ICD-10-CM | POA: Insufficient documentation

## 2019-07-06 DIAGNOSIS — R7989 Other specified abnormal findings of blood chemistry: Secondary | ICD-10-CM | POA: Insufficient documentation

## 2019-07-06 LAB — CBC WITH DIFFERENTIAL/PLATELET
Abs Immature Granulocytes: 0.01 10*3/uL (ref 0.00–0.07)
Basophils Absolute: 0 10*3/uL (ref 0.0–0.1)
Basophils Relative: 1 %
Eosinophils Absolute: 0.1 10*3/uL (ref 0.0–0.5)
Eosinophils Relative: 3 %
HCT: 37.3 % (ref 36.0–46.0)
Hemoglobin: 12.3 g/dL (ref 12.0–15.0)
Immature Granulocytes: 0 %
Lymphocytes Relative: 39 %
Lymphs Abs: 2.1 10*3/uL (ref 0.7–4.0)
MCH: 31.5 pg (ref 26.0–34.0)
MCHC: 33 g/dL (ref 30.0–36.0)
MCV: 95.6 fL (ref 80.0–100.0)
Monocytes Absolute: 0.5 10*3/uL (ref 0.1–1.0)
Monocytes Relative: 9 %
Neutro Abs: 2.6 10*3/uL (ref 1.7–7.7)
Neutrophils Relative %: 48 %
Platelets: 268 10*3/uL (ref 150–400)
RBC: 3.9 MIL/uL (ref 3.87–5.11)
RDW: 12.5 % (ref 11.5–15.5)
WBC: 5.4 10*3/uL (ref 4.0–10.5)
nRBC: 0 % (ref 0.0–0.2)

## 2019-07-06 LAB — CULTURE, BLOOD (ROUTINE X 2)
Culture: NO GROWTH
Special Requests: ADEQUATE

## 2019-07-06 LAB — URINALYSIS, ROUTINE W REFLEX MICROSCOPIC
Bilirubin Urine: NEGATIVE
Glucose, UA: NEGATIVE mg/dL
Hgb urine dipstick: NEGATIVE
Ketones, ur: NEGATIVE mg/dL
Leukocytes,Ua: NEGATIVE
Nitrite: NEGATIVE
Protein, ur: NEGATIVE mg/dL
Specific Gravity, Urine: 1.004 — ABNORMAL LOW (ref 1.005–1.030)
pH: 7 (ref 5.0–8.0)

## 2019-07-06 LAB — COMPREHENSIVE METABOLIC PANEL
ALT: 53 U/L — ABNORMAL HIGH (ref 0–44)
AST: 50 U/L — ABNORMAL HIGH (ref 15–41)
Albumin: 3.4 g/dL — ABNORMAL LOW (ref 3.5–5.0)
Alkaline Phosphatase: 155 U/L — ABNORMAL HIGH (ref 38–126)
Anion gap: 13 (ref 5–15)
BUN: 7 mg/dL (ref 6–20)
CO2: 21 mmol/L — ABNORMAL LOW (ref 22–32)
Calcium: 8.9 mg/dL (ref 8.9–10.3)
Chloride: 103 mmol/L (ref 98–111)
Creatinine, Ser: 0.81 mg/dL (ref 0.44–1.00)
GFR calc Af Amer: >60 mL/min (ref >60)
GFR calc non Af Amer: >60 mL/min (ref >60)
Glucose, Bld: 101 mg/dL — ABNORMAL HIGH (ref 70–99)
Potassium: 3 mmol/L — ABNORMAL LOW (ref 3.5–5.1)
Sodium: 137 mmol/L (ref 135–145)
Total Bilirubin: 0.8 mg/dL (ref 0.3–1.2)
Total Protein: 6.6 g/dL (ref 6.5–8.1)

## 2019-07-06 LAB — I-STAT BETA HCG BLOOD, ED (MC, WL, AP ONLY): I-stat hCG, quantitative: <5 m[IU]/mL (ref <5)

## 2019-07-06 MED ORDER — SODIUM CHLORIDE 0.9 % IV BOLUS
1000.0000 mL | Freq: Once | INTRAVENOUS | Status: AC
  Administered 2019-07-06: 1000 mL via INTRAVENOUS

## 2019-07-06 MED ORDER — IBUPROFEN 600 MG PO TABS
600.0000 mg | ORAL_TABLET | Freq: Four times a day (QID) | ORAL | 0 refills | Status: DC | PRN
Start: 2019-07-06 — End: 2021-10-04

## 2019-07-06 MED ORDER — PROCHLORPERAZINE EDISYLATE 10 MG/2ML IJ SOLN
10.0000 mg | Freq: Once | INTRAMUSCULAR | Status: AC
  Administered 2019-07-06: 10 mg via INTRAVENOUS
  Filled 2019-07-06: qty 2

## 2019-07-06 MED ORDER — KETOROLAC TROMETHAMINE 30 MG/ML IJ SOLN
30.0000 mg | Freq: Once | INTRAMUSCULAR | Status: AC
  Administered 2019-07-06: 30 mg via INTRAVENOUS
  Filled 2019-07-06: qty 1

## 2019-07-06 MED ORDER — POTASSIUM CHLORIDE CRYS ER 20 MEQ PO TBCR
40.0000 meq | EXTENDED_RELEASE_TABLET | Freq: Once | ORAL | Status: AC
  Administered 2019-07-06: 40 meq via ORAL
  Filled 2019-07-06: qty 2

## 2019-07-06 MED ORDER — CEPHALEXIN 500 MG PO CAPS: 500.0000 mg | ORAL_CAPSULE | Freq: Four times a day (QID) | ORAL | 0 refills | Status: AC

## 2019-07-06 MED ORDER — DIPHENHYDRAMINE HCL 50 MG/ML IJ SOLN
25.0000 mg | Freq: Once | INTRAMUSCULAR | Status: AC
  Administered 2019-07-06: 25 mg via INTRAVENOUS
  Filled 2019-07-06: qty 1

## 2019-07-06 NOTE — ED Provider Notes (Signed)
[facility] EMERGENCY DEPARTMENT Provider Note   CSN: [id number] Arrival date & time: 07/06/19  7619     History Chief Complaint  Patient presents with  . Headache  . Abnormal Lab    Meagan Burgess is a 29 y.o. female presents for evaluation of persistent headache for 6 days.  Reports the headache is generalized, radiates down into the neck.  She describes it as a throbbing sensation.  Initially it worsened leaning forward.  Now she states when she leans forward she becomes lightheaded but the headache does not worsen.  She denies vision changes, numbness or weakness of the extremities or fevers.  Additionally she notes generalized body aches.  She was recently seen in the emergency department on 07/01/2019 and was diagnosed with pyelonephritis and was discharged home with a course of Bactrim.  She states that her UTI symptoms have improved as has her flank pain however it has been persistent.  She reports bilateral flank pain which worsens with certain movements and with cough.  She denies shortness of breath, chest pain, cough, nasal congestion, sore throat.  She has been taking her antibiotics and has been taking half tablets of the hydrocodone that was prescribed to her at her ED visit with improvement in her symptoms.  Denies nausea or vomiting.  She received a phone call yesterday stating that her blood cultures were positive and they recommended presentation to the ED due to the persistent headache.  She tells me that she has had similar headaches intermittently for the last several months.  She is a current smoker.  Per chart review, only one of her blood cultures was positive and grew E. coli which was sensitive to Bactrim.   The history is provided by the patient.       Past Medical History:  Diagnosis Date  . Abnormal Pap smear    colpo- wnl  . Herpes   . Urinary tract infection     Patient Active Problem List   Diagnosis Date Noted  . Tobacco abuse  12/03/2015    Past Surgical History:  Procedure Laterality Date  . NO PAST SURGERIES       OB History    Gravida  3   Para  3   Term  3   Preterm  0   AB  0   Living  3     SAB  0   TAB  0   Ectopic  0   Multiple  0   Live Births  3           Family History  Problem Relation Age of Onset  . Hypertension Father   . Hypertension Maternal Grandmother   . Cancer Maternal Grandfather   . Diabetes Paternal Grandmother   . Anesthesia problems Neg Hx     Social History   Tobacco Use  . Smoking status: Current Every Day Smoker    Packs/day: 0.25    Years: 6.00    Pack years: 1.50    Types: Cigarettes    Last attempt to quit: 04/26/2011    Years since quitting: 8.2  . Smokeless tobacco: Never Used  Substance Use Topics  . Alcohol use: No  . Drug use: No    Home Medications Prior to Admission medications   Medication Sig Start Date End Date Taking? Authorizing Provider  acetaminophen (TYLENOL) 500 MG tablet Take 500 mg by mouth every 6 (six) hours as needed for mild pain.   Yes [provider]  amoxicillin (AMOXIL) 500 MG capsule Take 1 capsule (500 mg total) by mouth 2 (two) times daily. 11/30/18   Eulis Foster, FNP  cephALEXin (KEFLEX) 500 MG capsule Take 1 capsule (500 mg total) by mouth 4 (four) times daily for 7 days. 07/06/19 07/13/19  Michela Pitcher A, PA-C  chlorhexidine (PERIDEX) 0.12 % solution Use as directed 15 mLs in the mouth or throat 2 (two) times daily. Swish and spit.  Do not swallow. 11/07/17   Aviva Kluver B, PA-C  clotrimazole (LOTRIMIN) 1 % cream Apply 1 application topically 2 (two) times daily. Patient taking differently: Apply 1 application topically as needed (for rash).  03/07/16   Brock Bad, MD  ibuprofen (ADVIL) 600 MG tablet Take 1 tablet (600 mg total) by mouth every 6 (six) hours as needed. 07/06/19   Ashleyanne Hemmingway A, PA-C  lidocaine (XYLOCAINE) 2 % solution Use as directed 15 mLs in the mouth or throat as needed for  mouth pain. Swish and spit.  Do not swallow. 11/07/17   Aviva Kluver B, PA-C  medroxyPROGESTERone (DEPO-PROVERA) 150 MG/ML injection Inject 1 mL (150 mg total) into the muscle every 3 (three) months. 12/03/15   [place] Bing, MD  medroxyPROGESTERone (PROVERA) 10 MG tablet Take 1 tablet (10 mg total) by mouth daily. Patient not taking: Reported on 04/02/2016 03/07/16   Brock Bad, MD  Multiple Vitamins-Minerals (MULTIVITAMIN PO) Take 1 tablet by mouth 2 (two) times a week.    [provider]  ondansetron (ZOFRAN ODT) 4 MG disintegrating tablet Take 1 tablet (4 mg total) by mouth every 8 (eight) hours as needed for nausea or vomiting. 07/01/19   Long, Arlyss Repress, MD  oxyCODONE-acetaminophen (PERCOCET/ROXICET) 5-325 MG tablet Take 1 tablet by mouth every 6 (six) hours as needed for severe pain. 07/01/19   Long, Arlyss Repress, MD  Prenatal Vit-Fe Fumarate-FA (PRENATAL VITAMIN) 27-0.8 MG TABS Take 1 tablet by mouth daily. Patient not taking: Reported on 04/02/2016 12/03/15   [place] Bing, MD  tinidazole [facility]) 500 MG tablet Take 2 tablets (1,000 mg total) by mouth daily with breakfast. Patient not taking: Reported on 04/02/2016 03/15/16   Brock Bad, MD    Allergies    Patient has no known allergies.  Review of Systems   Review of Systems  Constitutional: Negative for chills and fever.  Eyes: Negative for visual disturbance.  Respiratory: Negative for shortness of breath.   Cardiovascular: Negative for chest pain.  Gastrointestinal: Negative for abdominal pain, nausea and vomiting.  Genitourinary: Positive for flank pain. Negative for dysuria, frequency and urgency.  Musculoskeletal: Positive for myalgias.  Neurological: Positive for headaches. Negative for weakness and numbness.  All other systems reviewed and are negative.   Physical Exam Updated Vital Signs BP 99/62   Pulse 82   Temp 98.6 F (37 C) (Oral)   Resp 19   LMP  (LMP Unknown)   SpO2 96%   Physical  Exam Vitals and nursing note reviewed.  Constitutional:      General: She is not in acute distress.    Appearance: She is well-developed.  HENT:     Head: Normocephalic and atraumatic.  Eyes:     General:        Right eye: No discharge.        Left eye: No discharge.     Conjunctiva/sclera: Conjunctivae normal.     Pupils: Pupils are equal, round, and reactive to light.  Neck:     Vascular:  No JVD.     Trachea: No tracheal deviation.  Cardiovascular:     Rate and Rhythm: Normal rate and regular rhythm.  Pulmonary:     Effort: Pulmonary effort is normal.  Abdominal:     General: Bowel sounds are normal. There is no distension.     Palpations: Abdomen is soft.     Tenderness: There is no guarding.  Musculoskeletal:     Cervical back: Normal range of motion and neck supple. No rigidity.  Lymphadenopathy:     Cervical: No cervical adenopathy.  Skin:    General: Skin is warm and dry.     Findings: No erythema.  Neurological:     Mental Status: She is alert and oriented to person, place, and time.     GCS: GCS eye subscore is 4. GCS verbal subscore is 5. GCS motor subscore is 6.     Comments: Mental Status:  Alert, thought content appropriate, able to give a coherent history. Speech fluent without evidence of aphasia. Able to follow 2 step commands without difficulty.  Cranial Nerves:  II:  Peripheral visual fields grossly normal, pupils equal, round, reactive to light III,IV, VI: ptosis not present, extra-ocular motions intact bilaterally  V,VII: smile symmetric, facial light touch sensation equal VIII: hearing grossly normal to voice  X: uvula elevates symmetrically  XI: bilateral shoulder shrug symmetric and strong XII: midline tongue extension without fassiculations Motor:  Normal tone. 5/5 strength of BUE and BLE major muscle groups including strong and equal grip strength and dorsiflexion/plantar flexion Sensory: light touch normal in all extremities. Cerebellar: normal  finger-to-nose with bilateral upper extremities, Romberg sign absent Gait: normal gait and balance. Able to walk on toes and heels with ease.     Psychiatric:        Behavior: Behavior normal.     ED Results / Procedures / Treatments   Labs (all labs ordered are listed, but only abnormal results are displayed) Labs Reviewed  COMPREHENSIVE METABOLIC PANEL - Abnormal; Notable for the following components:      Result Value   Potassium 3.0 (*)    CO2 21 (*)    Glucose, Bld 101 (*)    Albumin 3.4 (*)    AST 50 (*)    ALT 53 (*)    Alkaline Phosphatase 155 (*)    All other components within normal limits  URINALYSIS, ROUTINE W REFLEX MICROSCOPIC - Abnormal; Notable for the following components:   Color, Urine STRAW (*)    Specific Gravity, Urine 1.004 (*)    All other components within normal limits  CBC WITH DIFFERENTIAL/PLATELET  I-STAT BETA HCG BLOOD, ED ([facility], [facility], [facility] ONLY)    EKG None  Radiology CT Head Wo Contrast  Result Date: 07/06/2019 CLINICAL DATA:  29 year old female with history of headache. Dizziness. EXAM: CT HEAD WITHOUT CONTRAST TECHNIQUE: Contiguous axial images were obtained from the base of the skull through the vertex without intravenous contrast. COMPARISON:  Head CT 12/15/2008. FINDINGS: Brain: No evidence of acute infarction, hemorrhage, hydrocephalus, extra-axial collection or mass lesion/mass effect. Vascular: No hyperdense vessel or unexpected calcification. Skull: Normal. Negative for fracture or focal lesion. Sinuses/Orbits: No acute finding. Other: None. IMPRESSION: 1. No acute intracranial abnormalities. The appearance of the brain is normal. Electronically Signed   By: Trudie Reed M.D.   On: 07/06/2019 09:24    Procedures Procedures (including critical care time)  Medications Ordered in ED Medications  potassium chloride SA (KLOR-CON) CR tablet 40 mEq (has no administration  in time range)  ketorolac (TORADOL) 30 MG/ML injection 30 mg (has no  administration in time range)  sodium chloride 0.9 % bolus 1,000 mL (1,000 mLs Intravenous New Bag/Given 07/06/19 0903)  prochlorperazine (COMPAZINE) injection 10 mg (10 mg Intravenous Given 07/06/19 0904)  diphenhydrAMINE (BENADRYL) injection 25 mg (25 mg Intravenous Given 07/06/19 1610)    ED Course  I have reviewed the triage vital signs and the nursing notes.  Pertinent labs & imaging results that were available during my care of the patient were reviewed by me and considered in my medical decision making (see chart for details).    MDM Rules/Calculators/A&P                          Patient presenting today for evaluation of ongoing headaches, generalized myalgias, bilateral flank pain. She is afebrile, initially mildly tachycardic on triage but vital signs are stable on my assessment and subsequent reevaluations. She is nontoxic in appearance. She reports that overall her symptoms have been improving except her headache has been more persistent. The headache has been present intermittently for several months per her report. She has a normal neurologic examination with no focal deficits. At times she becomes lightheaded leaning forward. We will obtain a head CT, recheck labs and reassess. Of note, she was notified that she had a blood culture that was positive. Per my chart review, only one of her blood cultures was positive and it is sensitive to Bactrim which is the antibiotic she was discharged home on for treatment of pyelonephritis. Clinically she is well-appearing, nonseptic and I doubt she will require admission given her symptoms are improving overall.  Repeat labs today reviewed and interpreted by myself show resolved leukocytosis, no anemia. She is mildly hypokalemic, replenished orally in the ED. No renal insufficiency. Her LFTs are very mildly elevated today. Total bilirubin is within normal limits which suggests likely nonobstructive process. She has some mild generalized discomfort on  palpation of the abdomen on examination but no rebound, guarding, or focal tenderness. CT scan that was performed at her most recent ED visit 6 days ago showed no evidence of gallstones or other hepatobiliary pathology. Have a low suspicion of cholecystitis at this time. Suspect that her elevated LFTs could be in the setting of the Bactrim she has been taking. I informed her of this finding. She has been tolerating p.o. in the ED She has an appointment to follow-up with [facility] and establish care later this month and I encouraged her to keep the appointment and recommended recheck of her LFTs at that time.  With regards to her headache, head CT shows no acute intracranial abnormalities, no evidence of mass or bleed.  She was given a migraine cocktail in the ED with complete resolution of her headache from 6/10 in severity to 0/10 in severity.  On reevaluation she is resting comfortably in no distress.  Reports that she is feeling better and feels comfortable with discharge home.  I highly doubt bacterial meningitis given the duration of her symptoms and the chronicity.  Also doubt CVA, temporal arteritis.  Due to the elevated LFTs, we will discontinue the Bactrim and start her on Keflex.  Recommend close follow-up with PCP for reevaluation of symptoms.  Discussed strict ED return precautions. Patient verbalized understanding of and agreement with plan and is safe for discharge home at this time. Discussed with Dr. Bernette Mayers who agrees with assessment and plan at this time.  Final Clinical Impression(s) / ED Diagnoses Final diagnoses:  Generalized headache  Elevated LFTs    Rx / DC Orders ED Discharge Orders         Ordered    cephALEXin (KEFLEX) 500 MG capsule  4 times daily     Discontinue  Reprint     07/06/19 1033    ibuprofen (ADVIL) 600 MG tablet  Every 6 hours PRN     Discontinue  Reprint     07/06/19 1033           Jeanie SewerFawze, Delesha Pohlman A, PA-C 07/06/19 1100    Pollyann SavoySheldon,  Charles B, MD 07/06/19 1141

## 2019-07-06 NOTE — ED Triage Notes (Addendum)
Pt states she was seen here on 6/28 and diagnosed with a kidney infection was called yesterday due to pos blood culture, pt was placed on bactrim upon discharge on 6/28, no changes made to abx yesterday when called regarding blood culture. States that urinary symptoms are improved but pt still reports a headache and neck pain and thinks it is related to her kidney infection. Denies fevers or chills.

## 2019-07-06 NOTE — Discharge Instructions (Addendum)
Stop taking Bactrim (trimethoprim-sulfamethoxazole) and start taking Keflex (cephalexin).  Your liver enzymes were mildly elevated on lab work today which is likely due to the antibiotic so we will switch you to one that does not cause elevations in liver enzymes.  Both antibiotics are sensitive to the bacteria that grew in your urine and blood and should take care of the infection.  Please take all of your antibiotics until finished!   Take your antibiotics with food.  Common side effects of antibiotics include nausea, vomiting, abdominal discomfort, and diarrhea. You may help offset some of this with probiotics which you can buy or get in yogurt. Do not eat  or take the probiotics until 2 hours after your antibiotic.    Some studies suggest that certain antibiotics can reduce the efficacy of certain oral contraceptive pills (birth control), so please use additional contraceptives (condoms or other barrier method) while you are taking the antibiotics and for an additional 5 to 7 days afterwards if you are a female on these medications.  You can take 1 to 2 tablets of Tylenol (350mg -1000mg  depending on the dose) every 6 hours as needed for pain.  Do not exceed 4000 mg of Tylenol daily.  If your pain persists you can take a doses of ibuprofen in between doses of Tylenol.  I usually recommend 400 to 600 mg of ibuprofen every 6 hours.  Take this with food to avoid upset stomach issues.   Drink plenty of fluids and get rest.  Follow-up with your PCP ([facility]) as scheduled for reevaluation of your symptoms and for recheck of your liver enzymes.  Return to the emergency department if any concerning signs or symptoms develop such as fevers, neck stiffness, loss of consciousness, persistent vomiting or severe abdominal pains.

## 2019-07-29 ENCOUNTER — Inpatient Hospital Stay: Payer: Medicaid Other | Admitting: Internal Medicine

## 2019-08-17 ENCOUNTER — Telehealth: Payer: Medicaid Other | Admitting: Emergency Medicine

## 2019-08-17 DIAGNOSIS — N939 Abnormal uterine and vaginal bleeding, unspecified: Secondary | ICD-10-CM

## 2019-08-17 DIAGNOSIS — N898 Other specified noninflammatory disorders of vagina: Secondary | ICD-10-CM

## 2019-08-17 NOTE — Progress Notes (Signed)
Time spent: 5 min  Meagan Burgess,    Based on what you shared with me, I feel your condition warrants further evaluation and I recommend that you be seen for a face to face office visit.  You mentioned several symptoms including vaginal itching, foul smelling vaginal discharge, heavy vaginal bleeding.  These concerns are outside the scope of e-visit.  It is hard to determine the cause of your problems without a face to face or physical exam encounter.  Please seek medical evaluation as soon as possible by your primary care doctor, OBGYN or any of the clinics mentioned below.    NOTE: If you entered your credit card information for this eVisit, you will not be charged. You may see a "hold" on your card for the $35 but that hold will drop off and you will not have a charge processed.   If you are having a true medical emergency please call 911.      For an urgent face to face visit, [facility] has five urgent care centers for your convenience:      NEW:  [facility] at [facility] Directions [phone] [address] [place], [place] [address] . 10 am - 6pm Monday - Friday    [facility]) Get Driving Directions [phone] [address] [place], [place] [address] . 10 am to 8 pm Monday-Friday . 12 pm to 8 pm [facility] at [facility] Get Driving Directions [phone] [address] [place], [place] [address] . 8 am to 8 pm Monday-Friday . 9 am to 6 pm Saturday . 11 am to 6 pm Sunday     [facility] at [facility] Get Driving Directions  [phone] [address] [place], [place] [address] . 8 am to 8 pm Monday-Friday . 8 am to 4 pm [facility] at [facility] Directions [phone] [address] [place], [place] [address] . 12 pm to 6 pm Monday-Friday       Your e-visit answers were reviewed by a board certified advanced clinical practitioner to complete your personal care plan.  Thank you for using e-Visits.   Sharen Heck, PA-C [facility]

## 2019-08-21 ENCOUNTER — Ambulatory Visit: Payer: Medicaid Other | Attending: Physician Assistant | Admitting: Physician Assistant

## 2019-08-21 ENCOUNTER — Other Ambulatory Visit: Payer: Self-pay

## 2019-09-04 DIAGNOSIS — Z419 Encounter for procedure for purposes other than remedying health state, unspecified: Secondary | ICD-10-CM | POA: Diagnosis not present

## 2019-10-04 DIAGNOSIS — Z419 Encounter for procedure for purposes other than remedying health state, unspecified: Secondary | ICD-10-CM | POA: Diagnosis not present

## 2019-11-04 DIAGNOSIS — Z419 Encounter for procedure for purposes other than remedying health state, unspecified: Secondary | ICD-10-CM | POA: Diagnosis not present

## 2019-12-04 DIAGNOSIS — Z419 Encounter for procedure for purposes other than remedying health state, unspecified: Secondary | ICD-10-CM | POA: Diagnosis not present

## 2020-01-02 DIAGNOSIS — Z20822 Contact with and (suspected) exposure to covid-19: Secondary | ICD-10-CM | POA: Diagnosis not present

## 2020-01-04 DIAGNOSIS — Z419 Encounter for procedure for purposes other than remedying health state, unspecified: Secondary | ICD-10-CM | POA: Diagnosis not present

## 2020-02-04 DIAGNOSIS — Z419 Encounter for procedure for purposes other than remedying health state, unspecified: Secondary | ICD-10-CM | POA: Diagnosis not present

## 2020-03-03 DIAGNOSIS — Z419 Encounter for procedure for purposes other than remedying health state, unspecified: Secondary | ICD-10-CM | POA: Diagnosis not present

## 2020-04-03 DIAGNOSIS — Z419 Encounter for procedure for purposes other than remedying health state, unspecified: Secondary | ICD-10-CM | POA: Diagnosis not present

## 2020-05-03 DIAGNOSIS — Z419 Encounter for procedure for purposes other than remedying health state, unspecified: Secondary | ICD-10-CM | POA: Diagnosis not present

## 2020-06-03 DIAGNOSIS — Z419 Encounter for procedure for purposes other than remedying health state, unspecified: Secondary | ICD-10-CM | POA: Diagnosis not present

## 2020-07-03 DIAGNOSIS — Z419 Encounter for procedure for purposes other than remedying health state, unspecified: Secondary | ICD-10-CM | POA: Diagnosis not present

## 2020-08-03 DIAGNOSIS — Z419 Encounter for procedure for purposes other than remedying health state, unspecified: Secondary | ICD-10-CM | POA: Diagnosis not present

## 2020-09-03 DIAGNOSIS — Z419 Encounter for procedure for purposes other than remedying health state, unspecified: Secondary | ICD-10-CM | POA: Diagnosis not present

## 2020-10-03 DIAGNOSIS — Z419 Encounter for procedure for purposes other than remedying health state, unspecified: Secondary | ICD-10-CM | POA: Diagnosis not present

## 2020-11-03 DIAGNOSIS — Z419 Encounter for procedure for purposes other than remedying health state, unspecified: Secondary | ICD-10-CM | POA: Diagnosis not present

## 2020-12-03 DIAGNOSIS — Z419 Encounter for procedure for purposes other than remedying health state, unspecified: Secondary | ICD-10-CM | POA: Diagnosis not present

## 2021-01-03 DIAGNOSIS — Z419 Encounter for procedure for purposes other than remedying health state, unspecified: Secondary | ICD-10-CM | POA: Diagnosis not present

## 2021-02-03 DIAGNOSIS — Z419 Encounter for procedure for purposes other than remedying health state, unspecified: Secondary | ICD-10-CM | POA: Diagnosis not present

## 2021-03-03 DIAGNOSIS — Z419 Encounter for procedure for purposes other than remedying health state, unspecified: Secondary | ICD-10-CM | POA: Diagnosis not present

## 2021-04-03 DIAGNOSIS — Z419 Encounter for procedure for purposes other than remedying health state, unspecified: Secondary | ICD-10-CM | POA: Diagnosis not present

## 2021-05-03 DIAGNOSIS — Z419 Encounter for procedure for purposes other than remedying health state, unspecified: Secondary | ICD-10-CM | POA: Diagnosis not present

## 2021-06-03 DIAGNOSIS — Z419 Encounter for procedure for purposes other than remedying health state, unspecified: Secondary | ICD-10-CM | POA: Diagnosis not present

## 2021-07-03 DIAGNOSIS — Z419 Encounter for procedure for purposes other than remedying health state, unspecified: Secondary | ICD-10-CM | POA: Diagnosis not present

## 2021-08-03 DIAGNOSIS — Z419 Encounter for procedure for purposes other than remedying health state, unspecified: Secondary | ICD-10-CM | POA: Diagnosis not present

## 2021-09-03 DIAGNOSIS — Z419 Encounter for procedure for purposes other than remedying health state, unspecified: Secondary | ICD-10-CM | POA: Diagnosis not present

## 2021-09-17 IMAGING — CT CT ABD-PELV W/ CM
2 of 4 series · 16 of 46 positions shown, 18 images · IV contrast (omnipaque)
Comparison: None.

CLINICAL DATA: Abdominal pain, fever

EXAM:
CT ABDOMEN AND PELVIS WITH CONTRAST
TECHNIQUE: Multidetector CT imaging of the abdomen and pelvis was performed
using the standard protocol following bolus administration of
intravenous contrast.
CONTRAST:  100mL OMNIPAQUE IOHEXOL 300 MG/ML  SOLN

[Series 3: abdomen 5.0 · axial · 0.68mm/px · z∈[-455,-75]mm · 13 of 86 slices shown, 15 images]
[im 5/86  soft-tissue]
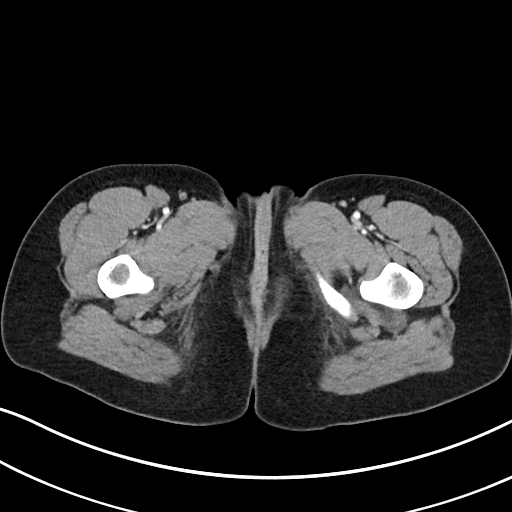
[im 5/86  bone]
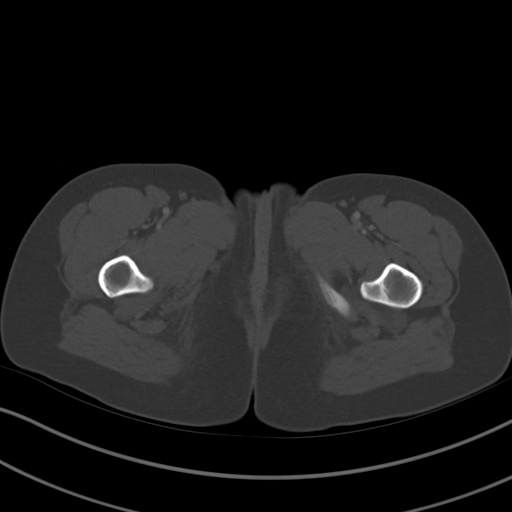
[im 14/86  soft-tissue]
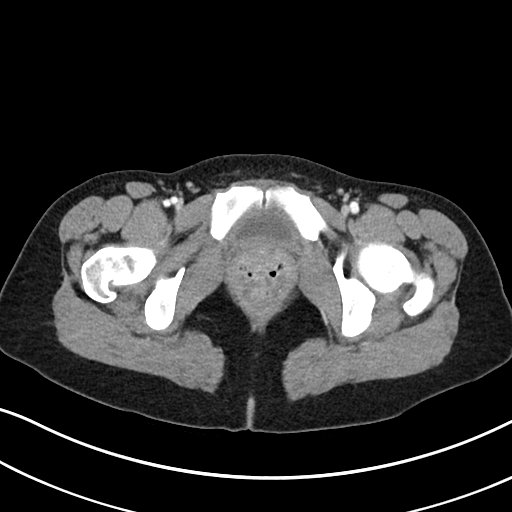
[im 18/86  soft-tissue]
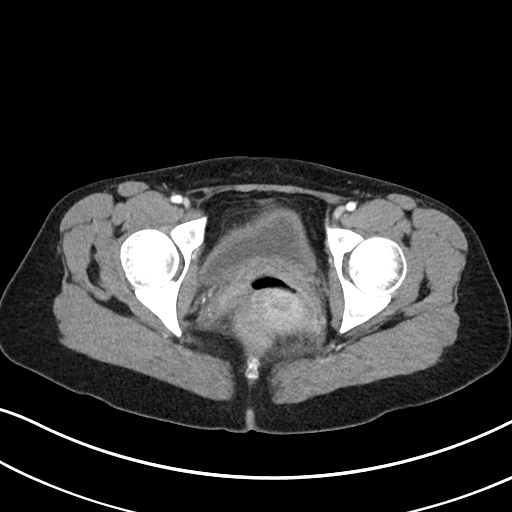
[im 23/86  soft-tissue]
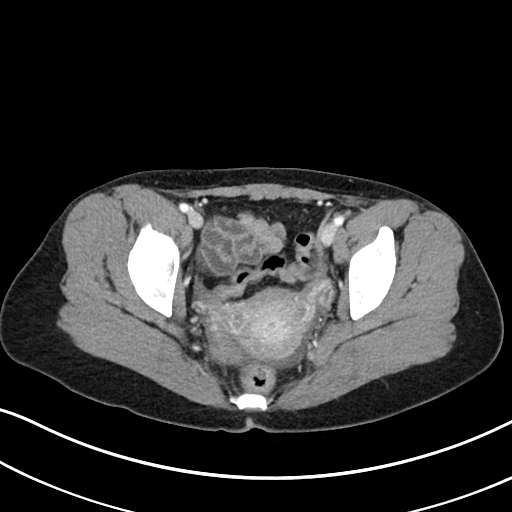
[im 32/86  soft-tissue]
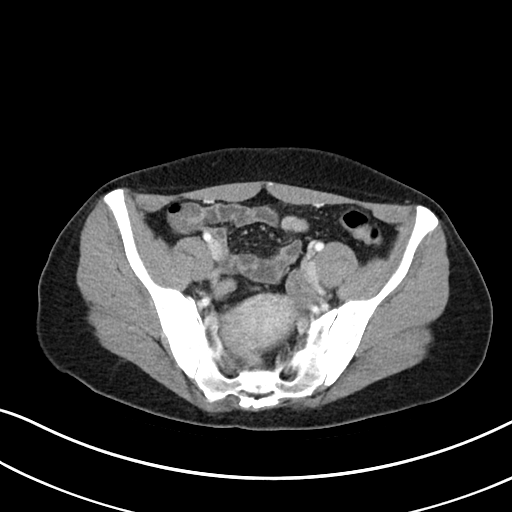
[im 36/86  soft-tissue]
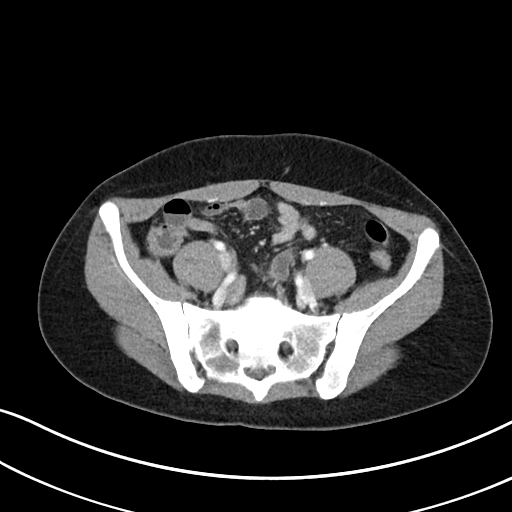
[im 45/86  soft-tissue]
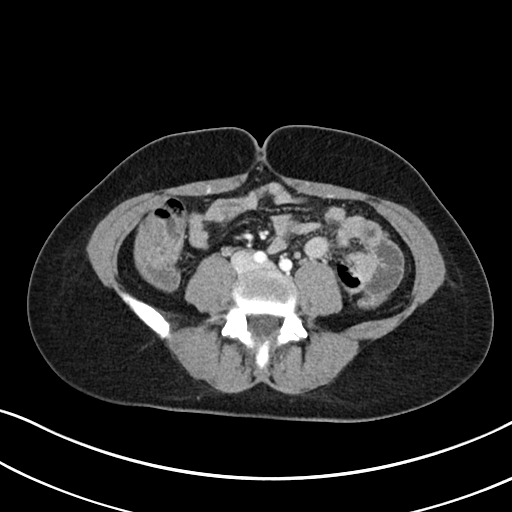
[im 50/86  soft-tissue]
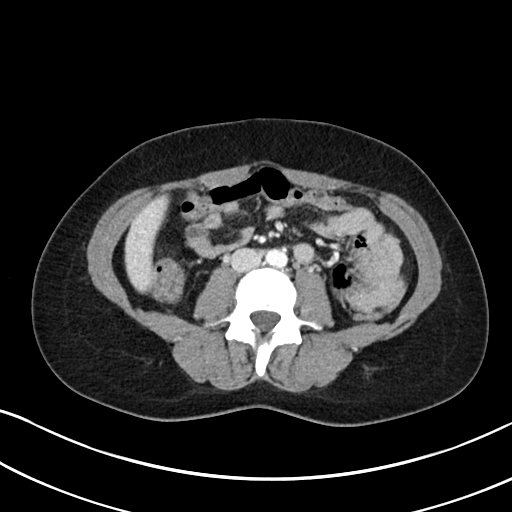
[im 54/86  soft-tissue]
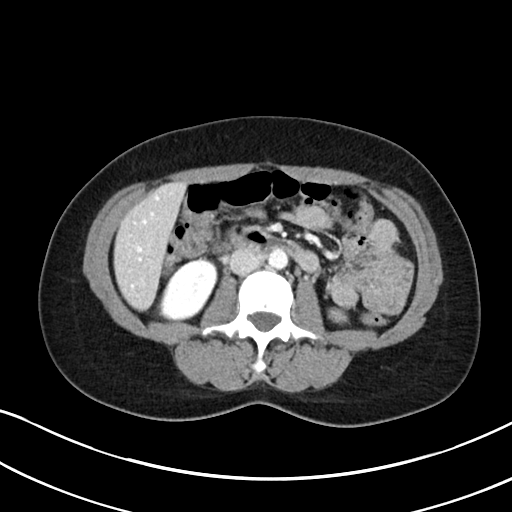
[im 54/86  bone]
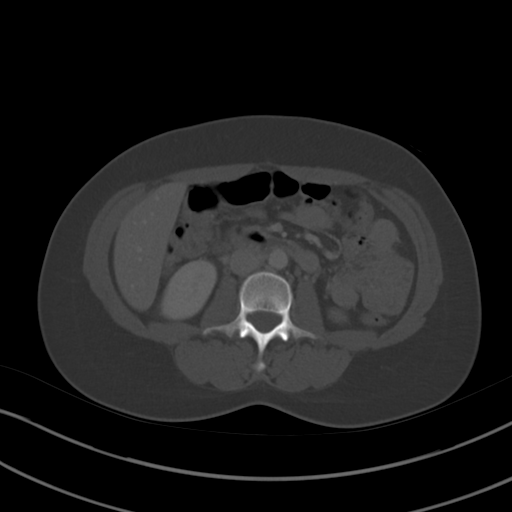
[im 63/86  soft-tissue]
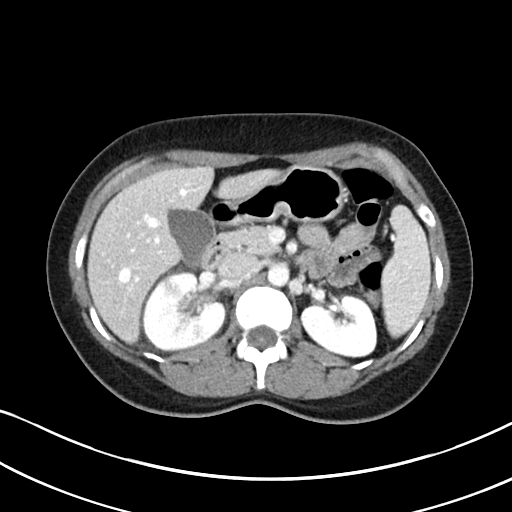
[im 68/86  soft-tissue]
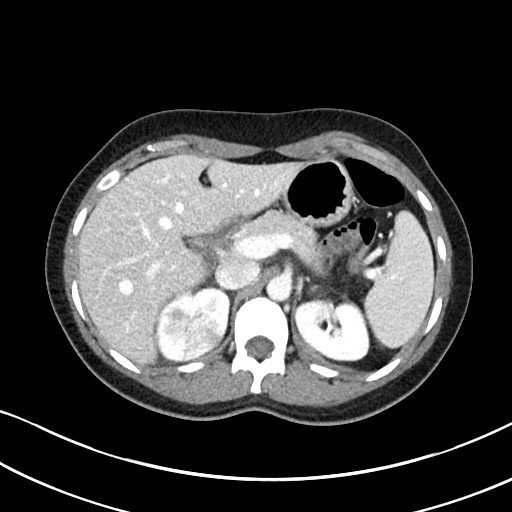
[im 72/86  soft-tissue]
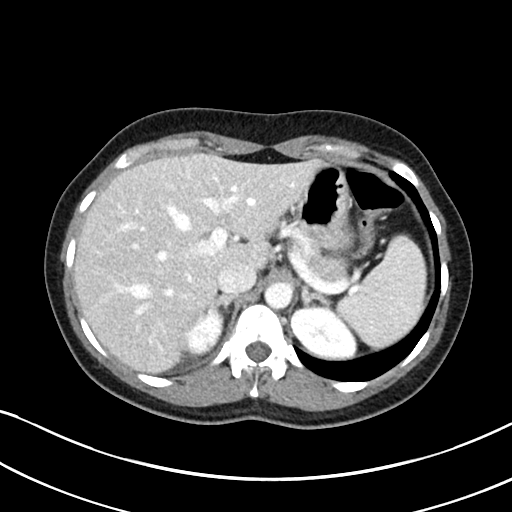
[im 81/86  soft-tissue]
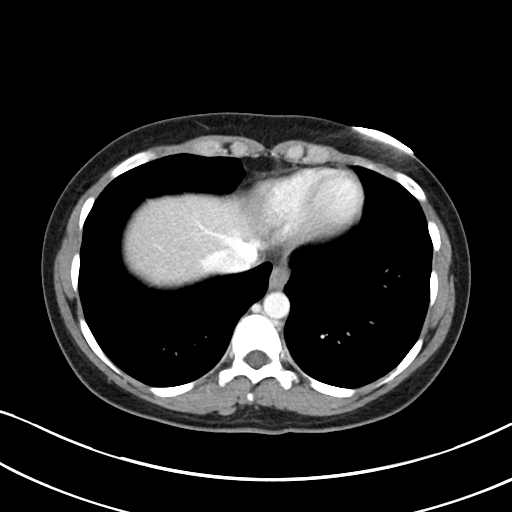

[Series 6: abdomen 3.0 mpr cor · coronal · 0.61mm/px · 3 of 77 slices shown]
[im 26/77  soft-tissue]
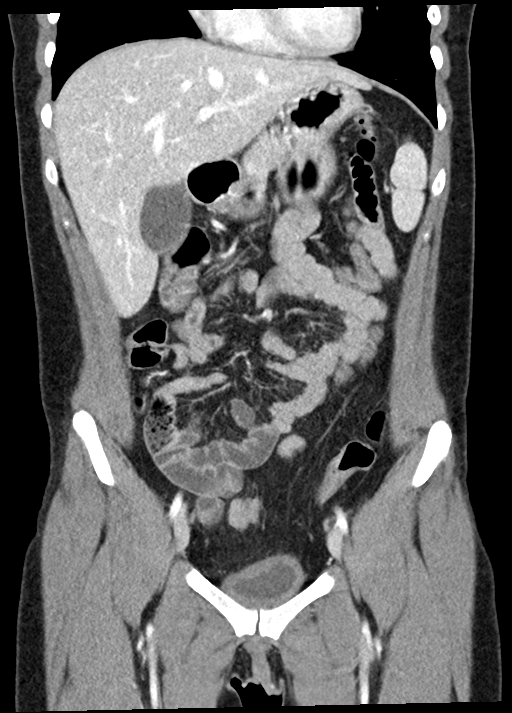
[im 34/77  soft-tissue]
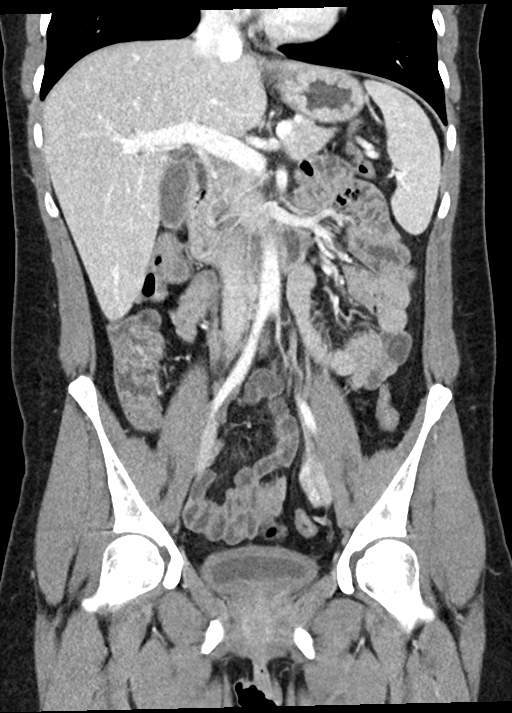
[im 43/77  soft-tissue]
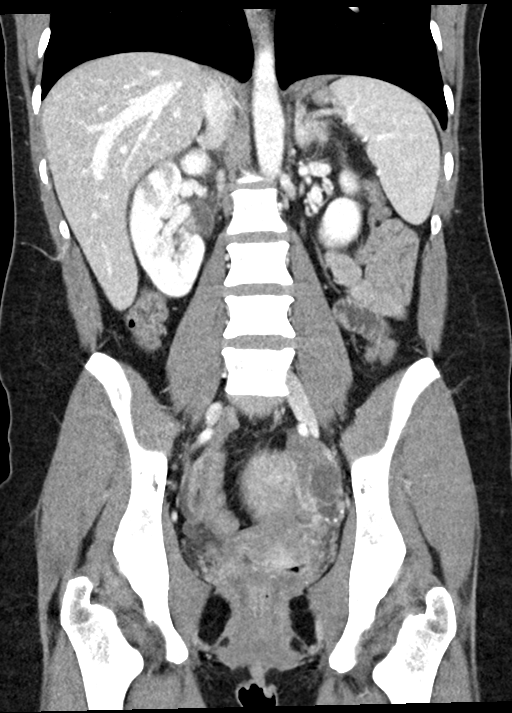

[16 of 46 positions shown; findings below may reference images not displayed]

FINDINGS: Lower chest: Lung bases are clear. No effusions. Heart is normal
size.

Hepatobiliary: No focal hepatic abnormality. Gallbladder
unremarkable.

Pancreas: No focal abnormality or ductal dilatation.

Spleen: No focal abnormality.  Normal size.

Adrenals/Urinary Tract: Areas of decreased enhancement noted
throughout the right kidney compatible with pyelonephritis. No
hydronephrosis or stones. Adrenal glands, left kidney and urinary
bladder unremarkable.

Stomach/Bowel: Stomach, large and small bowel grossly unremarkable.
Appendix is normal.

Vascular/Lymphatic: No evidence of aneurysm or adenopathy.

Reproductive: Uterus and adnexa unremarkable.  No mass.

Other: Small amount of free fluid in the pelvis.  No free air.

Musculoskeletal: No acute bony abnormality.
IMPRESSION: Areas of poor enhancement in the right kidney with striated
nephrogram compatible with pyelonephritis.

## 2021-10-03 DIAGNOSIS — Z419 Encounter for procedure for purposes other than remedying health state, unspecified: Secondary | ICD-10-CM | POA: Diagnosis not present

## 2021-10-04 ENCOUNTER — Emergency Department (HOSPITAL_COMMUNITY)
Admission: EM | Admit: 2021-10-04 | Discharge: 2021-10-04 | Disposition: A | Discharge status: Home / Self Care | Payer: Medicaid Other | Attending: Emergency Medicine | Admitting: Emergency Medicine

## 2021-10-04 ENCOUNTER — Other Ambulatory Visit: Payer: Self-pay

## 2021-10-04 ENCOUNTER — Encounter (HOSPITAL_COMMUNITY): Payer: Self-pay

## 2021-10-04 DIAGNOSIS — R221 Localized swelling, mass and lump, neck: Secondary | ICD-10-CM | POA: Diagnosis not present

## 2021-10-04 DIAGNOSIS — N898 Other specified noninflammatory disorders of vagina: Secondary | ICD-10-CM

## 2021-10-04 DIAGNOSIS — A5901 Trichomonal vulvovaginitis: Secondary | ICD-10-CM | POA: Insufficient documentation

## 2021-10-04 DIAGNOSIS — N760 Acute vaginitis: Secondary | ICD-10-CM | POA: Diagnosis not present

## 2021-10-04 LAB — CBC WITH DIFFERENTIAL/PLATELET
Abs Immature Granulocytes: 0.02 10*3/uL (ref 0.00–0.07)
Basophils Absolute: 0.1 10*3/uL (ref 0.0–0.1)
Basophils Relative: 1 %
Eosinophils Absolute: 0.2 10*3/uL (ref 0.0–0.5)
Eosinophils Relative: 3 %
HCT: 40.1 % (ref 36.0–46.0)
Hemoglobin: 13.7 g/dL (ref 12.0–15.0)
Immature Granulocytes: 0 %
Lymphocytes Relative: 31 %
Lymphs Abs: 2.5 10*3/uL (ref 0.7–4.0)
MCH: 31.9 pg (ref 26.0–34.0)
MCHC: 34.2 g/dL (ref 30.0–36.0)
MCV: 93.3 fL (ref 80.0–100.0)
Monocytes Absolute: 0.6 10*3/uL (ref 0.1–1.0)
Monocytes Relative: 8 %
Neutro Abs: 4.6 10*3/uL (ref 1.7–7.7)
Neutrophils Relative %: 57 %
Platelets: 222 10*3/uL (ref 150–400)
RBC: 4.3 MIL/uL (ref 3.87–5.11)
RDW: 12 % (ref 11.5–15.5)
WBC: 8 10*3/uL (ref 4.0–10.5)
nRBC: 0 % (ref 0.0–0.2)

## 2021-10-04 LAB — URINALYSIS, ROUTINE W REFLEX MICROSCOPIC
Bilirubin Urine: NEGATIVE
Glucose, UA: NEGATIVE mg/dL
Hgb urine dipstick: NEGATIVE
Ketones, ur: NEGATIVE mg/dL
Nitrite: NEGATIVE
Protein, ur: NEGATIVE mg/dL
Specific Gravity, Urine: 1.012 (ref 1.005–1.030)
pH: 6 (ref 5.0–8.0)

## 2021-10-04 LAB — I-STAT BETA HCG BLOOD, ED (MC, WL, AP ONLY): I-stat hCG, quantitative: <5 m[IU]/mL (ref <5)

## 2021-10-04 LAB — COMPREHENSIVE METABOLIC PANEL
ALT: 31 U/L (ref 0–44)
AST: 26 U/L (ref 15–41)
Albumin: 3.8 g/dL (ref 3.5–5.0)
Alkaline Phosphatase: 78 U/L (ref 38–126)
Anion gap: 8 (ref 5–15)
BUN: 6 mg/dL (ref 6–20)
CO2: 22 mmol/L (ref 22–32)
Calcium: 8.7 mg/dL — ABNORMAL LOW (ref 8.9–10.3)
Chloride: 106 mmol/L (ref 98–111)
Creatinine, Ser: 0.87 mg/dL (ref 0.44–1.00)
GFR, Estimated: >60 mL/min (ref >60)
Glucose, Bld: 142 mg/dL — ABNORMAL HIGH (ref 70–99)
Potassium: 3.4 mmol/L — ABNORMAL LOW (ref 3.5–5.1)
Sodium: 136 mmol/L (ref 135–145)
Total Bilirubin: 0.5 mg/dL (ref 0.3–1.2)
Total Protein: 6.8 g/dL (ref 6.5–8.1)

## 2021-10-04 LAB — WET PREP, GENITAL
Clue Cells Wet Prep HPF POC: NONE SEEN
Sperm: NONE SEEN
WBC, Wet Prep HPF POC: >=10 — AB (ref <10)
Yeast Wet Prep HPF POC: NONE SEEN

## 2021-10-04 LAB — LIPASE, BLOOD: Lipase: 37 U/L (ref 11–51)

## 2021-10-04 MED ORDER — METRONIDAZOLE 500 MG PO TABS
2000.0000 mg | ORAL_TABLET | Freq: Once | ORAL | Status: AC
  Administered 2021-10-04: 2000 mg via ORAL
  Filled 2021-10-04: qty 4

## 2021-10-04 MED ORDER — CEFTRIAXONE SODIUM 500 MG IJ SOLR
500.0000 mg | Freq: Once | INTRAMUSCULAR | Status: AC
  Administered 2021-10-04: 500 mg via INTRAMUSCULAR
  Filled 2021-10-04: qty 500

## 2021-10-04 MED ORDER — FLUTICASONE PROPIONATE 50 MCG/ACT NA SUSP: 1.0000 | Freq: Every day | NASAL | 0 refills | Status: AC

## 2021-10-04 MED ORDER — AZITHROMYCIN 250 MG PO TABS
1000.0000 mg | ORAL_TABLET | Freq: Once | ORAL | Status: AC
  Administered 2021-10-04: 1000 mg via ORAL
  Filled 2021-10-04: qty 4

## 2021-10-04 MED ORDER — LIDOCAINE HCL (PF) 1 % IJ SOLN
1.0000 mL | Freq: Once | INTRAMUSCULAR | Status: AC
  Administered 2021-10-04: 1 mL
  Filled 2021-10-04: qty 5

## 2021-10-04 NOTE — ED Provider Triage Note (Signed)
Emergency Medicine Provider Triage Evaluation Note  Meagan Burgess , a 31 y.o. female  was evaluated in triage.  Pt complains of left-sided neck pain.  Patient states swelling to left-sided neck for the past month.  Swelling has improved.  No dental pain.  Denies sore throat.  No congestion.  Patient also endorses left flank pain.  Admits to dysuria and increased vaginal discharge.  No fever or chills.  No rash.  Denies history of kidney stones.  Review of Systems  Positive: Neck pain, flank pain Negative: fever  Physical Exam  BP 134/62 (BP Location: Right Arm)   Pulse 80   Temp 98.7 F (37.1 C) (Oral)   Resp 16   SpO2 98%  Gen:   Awake, no distress   Resp:  Normal effort  MSK:   Moves extremities without difficulty  Other:    Medical Decision Making  Medically screening exam initiated at 8:22 AM.  Appropriate orders placed.  YAMINAH CLAYBORN was informed that the remainder of the evaluation will be completed by another provider, this initial triage assessment does not replace that evaluation, and the importance of remaining in the ED until their evaluation is complete.  Labs UA   Suzy Bouchard, [place] 10/04/21 [phone]

## 2021-10-04 NOTE — Discharge Instructions (Addendum)
You were evaluated for left neck lump and vaginal discharge. Neck lump should resolve but I have sent Flonase to your pharmacy to help with any sinus congestion. You received antibiotics in the ED today. Please have your partner be tested for trichomonas. Please follow up with [facility] to establish primary care.

## 2021-10-04 NOTE — ED Triage Notes (Signed)
Patient complains of lump to left lateral aspect of neck. Thought resolved but pain has returned. Also concerned that she has had some pelvis pain and denies dysuria

## 2021-10-04 NOTE — ED Provider Notes (Addendum)
[facility] EMERGENCY DEPARTMENT Provider Note   CSN: [id number] Arrival date & time: 10/04/21  0815     History No chief complaint on file.   Meagan Burgess is a 31 y.o. female.  Patient presents with multiple concerns.  She reports a left-sided neck lump for 1 month. Noted she was not feeling well about a month ago when her mother was sick. She said the bump was larger but has decreased in size. Some pain with rotating neck. No trouble swallowing or dental infection. Denies any fever, chills, n/v.   She also reports vaginal discharge for a few months. She describes it as white thin discharge that is not malodorous. Localized pain in LLQ that has been ongoing for years. Sexually active with one female partner. 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 Does not use protection. Not on contraceptives. Last intercourse was 2-3 days ago.      Home Medications Prior to Admission medications   Medication Sig Start Date End Date Taking? Authorizing Provider  acetaminophen (TYLENOL) 500 MG tablet Take 1,000 mg by mouth daily as needed for mild pain or headache.   Yes [provider]  fluticasone (FLONASE) 50 MCG/ACT nasal spray Place 1 spray into both nostrils daily. 10/04/21 10/04/22 Yes Angelique Blonder, DO  ibuprofen (ADVIL) 200 MG tablet Take 800 mg by mouth daily as needed for headache, mild pain or cramping.   Yes [provider]      Allergies    Patient has no known allergies.    Review of Systems   Review of Systems  Constitutional:  Negative for chills and fever.  HENT:  Negative for rhinorrhea, sinus pain and sore throat.   Respiratory:  Negative for cough and shortness of breath.   Gastrointestinal:  Negative for nausea and vomiting.  Genitourinary:  Positive for vaginal discharge. Negative for dyspareunia, dysuria, genital sores, hematuria and vaginal pain.  Skin:        Left neck lump    Physical Exam Updated Vital Signs BP 108/63 (BP  Location: Right Arm)   Pulse 80   Temp 97.9 F (36.6 C) (Oral)   Resp 17   SpO2 98%  Physical Exam Exam conducted with a chaperone present.  Constitutional:      General: She is not in acute distress.    Appearance: She is not ill-appearing.  HENT:     Head: Normocephalic and atraumatic.     Mouth/Throat:     Mouth: Mucous membranes are moist.     Pharynx: Oropharynx is clear.  Pulmonary:     Effort: Pulmonary effort is normal.  Genitourinary:    General: Normal vulva.     Pubic Area: No rash.      Labia:        Right: No rash.        Left: No rash.      Vagina: Vaginal discharge (thin white) present. No erythema, tenderness or bleeding.     Cervix: Normal.  Musculoskeletal:     Cervical back: Normal range of motion and neck supple. No edema or erythema.  Lymphadenopathy:     Cervical: No cervical adenopathy.  Skin:    General: Skin is warm and dry.  Neurological:     Mental Status: She is alert.     ED Results / Procedures / Treatments   Labs (all labs ordered are listed, but only abnormal results are displayed) Labs Reviewed  WET PREP, GENITAL - Abnormal; Notable for the  following components:      Result Value   Trich, Wet Prep PRESENT (*)    WBC, Wet Prep HPF POC >=10 (*)    All other components within normal limits  COMPREHENSIVE METABOLIC PANEL - Abnormal; Notable for the following components:   Potassium 3.4 (*)    Glucose, Bld 142 (*)    Calcium 8.7 (*)    All other components within normal limits  URINALYSIS, ROUTINE W REFLEX MICROSCOPIC - Abnormal; Notable for the following components:   Leukocytes,Ua MODERATE (*)    Bacteria, UA RARE (*)    All other components within normal limits  CBC WITH DIFFERENTIAL/PLATELET  LIPASE, BLOOD  I-STAT BETA HCG BLOOD, ED ([facility], [facility], [facility] ONLY)  GC/CHLAMYDIA PROBE AMP ([facility]) NOT AT [facility]    EKG None  Radiology No results found.  Procedures Procedures    Medications Ordered in ED Medications   cefTRIAXone (ROCEPHIN) injection 500 mg (500 mg Intramuscular Given 10/04/21 1331)  lidocaine (PF) (XYLOCAINE) 1 % injection 1-2.1 mL (1 mL Other Given 10/04/21 1331)  azithromycin (ZITHROMAX) tablet 1,000 mg (1,000 mg Oral Given 10/04/21 1329)  metroNIDAZOLE (FLAGYL) tablet 2,000 mg (2,000 mg Oral Given 10/04/21 1329)    ED Course/ Medical Decision Making/ A&P                           Medical Decision Making Risk Prescription drug management.  Patient presents with left neck lump and vaginal discharge for past few months. Neck lump after possible recent URI. Has since improved and decreased in size. Exam did not show any significant cervical lymphadenopathy. Pelvic exam with chaperone present showed thin white discharge but remaining exam normal. Vaginal swab positive for Trich. Concern for gonorrhea and chlamydia, but pending lab results.  Afebrile with stable vitals. No leukocytosis and remaining labs unremarkable. UA did show some rare bacteria and leukocytes but she is asymptomatic. She was given antibiotics during ED course. Discussed having her partner tested soon.  Patient is stable for discharge with follow up to establish PCP. Return precautions given.  Final Clinical Impression(s) / ED Diagnoses Final diagnoses:  Vaginal discharge  Trichomonas vaginitis    Rx / DC Orders ED Discharge Orders          Ordered    fluticasone (FLONASE) 50 MCG/ACT nasal spray  Daily        10/04/21 1417              Angelique Blonder, DO 10/04/21 1520    Angelique Blonder, DO 10/04/21 1523    Isla Pence, MD 10/04/21 1540

## 2021-10-05 LAB — GC/CHLAMYDIA PROBE AMP (~~LOC~~) NOT AT ARMC
Chlamydia: NEGATIVE
Comment: NEGATIVE
Comment: NORMAL
Neisseria Gonorrhea: POSITIVE — AB

## 2021-11-03 DIAGNOSIS — Z419 Encounter for procedure for purposes other than remedying health state, unspecified: Secondary | ICD-10-CM | POA: Diagnosis not present

## 2021-12-03 DIAGNOSIS — Z419 Encounter for procedure for purposes other than remedying health state, unspecified: Secondary | ICD-10-CM | POA: Diagnosis not present

## 2022-01-03 DIAGNOSIS — Z419 Encounter for procedure for purposes other than remedying health state, unspecified: Secondary | ICD-10-CM | POA: Diagnosis not present

## 2022-02-03 DIAGNOSIS — Z419 Encounter for procedure for purposes other than remedying health state, unspecified: Secondary | ICD-10-CM | POA: Diagnosis not present

## 2022-02-20 ENCOUNTER — Encounter (HOSPITAL_COMMUNITY): Payer: Self-pay | Admitting: Emergency Medicine

## 2022-02-20 ENCOUNTER — Other Ambulatory Visit: Payer: Self-pay

## 2022-02-20 ENCOUNTER — Emergency Department (HOSPITAL_COMMUNITY)
Admission: EM | Admit: 2022-02-20 | Discharge: 2022-02-21 | Disposition: A | Discharge status: Home / Self Care | Payer: Medicaid Other | Attending: Emergency Medicine | Admitting: Emergency Medicine

## 2022-02-20 DIAGNOSIS — K0889 Other specified disorders of teeth and supporting structures: Secondary | ICD-10-CM | POA: Diagnosis present

## 2022-02-20 DIAGNOSIS — K047 Periapical abscess without sinus: Secondary | ICD-10-CM | POA: Diagnosis not present

## 2022-02-20 DIAGNOSIS — R590 Localized enlarged lymph nodes: Secondary | ICD-10-CM | POA: Insufficient documentation

## 2022-02-20 NOTE — ED Triage Notes (Signed)
Pt reports bottom right side "molar pain."  Pt expresses that she feels she needs antibiotics as the pain is not residing despite taking Tylenol and advil.

## 2022-02-21 MED ORDER — AMOXICILLIN-POT CLAVULANATE 875-125 MG PO TABS: 1.0000 | ORAL_TABLET | Freq: Two times a day (BID) | ORAL | 0 refills | Status: DC

## 2022-02-21 MED ORDER — AMOXICILLIN-POT CLAVULANATE 875-125 MG PO TABS
1.0000 | ORAL_TABLET | Freq: Once | ORAL | Status: AC
  Administered 2022-02-21: 1 via ORAL
  Filled 2022-02-21: qty 1

## 2022-02-21 MED ORDER — OXYCODONE-ACETAMINOPHEN 5-325 MG PO TABS
1.0000 | ORAL_TABLET | Freq: Once | ORAL | Status: AC
  Administered 2022-02-21: 1 via ORAL
  Filled 2022-02-21: qty 1

## 2022-02-21 NOTE — Discharge Instructions (Addendum)
You have a dental infection. Please take entire course of antibiotics as directed.  Continue using ibuprofen / Tylenol, as well as prescribed Orajel for pain.  You will need to follow-up with your dentist for continued management of this. Please see dental resources below. Return to the emergency department for fevers, swelling or pain under the tongue or in the neck, difficulty breathing or swallowing, nausea or vomiting that does not stop, or any other new or concerning symptoms.

## 2022-02-21 NOTE — ED Provider Notes (Signed)
[name]   CSN: [id number] Arrival date & time: 02/20/22  1952     History  Chief Complaint  Patient presents with   Dental Pain    Meagan Burgess is a 32 y.o. female who presetns with 4 day sof R lower dental pain, concerned for dental infection. Hx of same. No fevers, chills, dificulty/pain with swallowing, though pain does radiate down the right side of the neck per patient.   I have reviewed her medical records. No meds daily. Minimal improvement with pain with tylenol and ibuprofen at home.   HPI     Home Medications Prior to Admission medications   Medication Sig Start Date End Date Taking? Authorizing Provider  amoxicillin-clavulanate (AUGMENTIN) 875-125 MG tablet Take 1 tablet by mouth every 12 (twelve) hours. 02/21/22  Yes Dallen Bunte, Gypsy Balsam, PA-C  acetaminophen (TYLENOL) 500 MG tablet Take 1,000 mg by mouth daily as needed for mild pain or headache.    [provider]  fluticasone (FLONASE) 50 MCG/ACT nasal spray Place 1 spray into both nostrils daily. 10/04/21 10/04/22  Angelique Blonder, DO  ibuprofen (ADVIL) 200 MG tablet Take 800 mg by mouth daily as needed for headache, mild pain or cramping.    [provider]      Allergies    Patient has no known allergies.    Review of Systems   Review of Systems  Constitutional: Negative.   HENT:  Positive for dental problem.     Physical Exam Updated Vital Signs BP 114/80 (BP Location: Right Arm)   Pulse (!) 59   Temp 98.4 F (36.9 C) (Oral)   Resp 18   Ht 5' 2"$  (1.575 m)   Wt 54.4 kg   SpO2 100%   BMI 21.95 kg/m  Physical Exam Vitals and nursing note reviewed.  Constitutional:      Appearance: She is not ill-appearing or toxic-appearing.  HENT:     Head: Normocephalic and atraumatic.     Mouth/Throat:     Dentition: Abnormal dentition. Dental tenderness, gingival swelling and dental caries present. No dental abscesses.      Pharynx: Oropharynx is clear. Uvula midline.      Comments: No sublingual or submental TTP.  Eyes:     General: No scleral icterus.       Right eye: No discharge.        Left eye: No discharge.     Conjunctiva/sclera: Conjunctivae normal.  Neck:     Trachea: Trachea and phonation normal.  Cardiovascular:     Heart sounds: Normal heart sounds.  Pulmonary:     Effort: Pulmonary effort is normal.  Musculoskeletal:     Cervical back: Normal range of motion.  Lymphadenopathy:     Cervical: Cervical adenopathy present.     Right cervical: Superficial cervical adenopathy present.  Skin:    General: Skin is warm and dry.  Neurological:     General: No focal deficit present.     Mental Status: She is alert.  Psychiatric:        Mood and Affect: Mood normal.     ED Results / Procedures / Treatments   Labs (all labs ordered are listed, but only abnormal results are displayed) Labs Reviewed - No data to display  EKG None  Radiology No results found.  Procedures Procedures    Medications Ordered in ED Medications  oxyCODONE-acetaminophen (PERCOCET/ROXICET) 5-325 MG per tablet 1 tablet (1 tablet Oral Given 02/21/22  0102)  amoxicillin-clavulanate (AUGMENTIN) 875-125 MG per tablet 1 tablet (1 tablet Oral Given 02/21/22 0102)    ED Course/ Medical Decision Making/ A&P                            Medical Decision Making 32 year old female who presents with concern for dental pain.   Vsn normal on intake, cardiopulmonary exam is normal, dental exam as above, No pyshical exam findings to suggest ludwig's angina or oropharyngeal abscess.   Clinical picture most consistent with periapical infection.   Risk Prescription drug management.  Clinical concern for emergent underlying etiology that would warrant further ED workup or inpatient management is exceedingly low.   First dose of antibiotics administered in the ED, Rx provided for outpatient setting.   Meagan Burgess   voiced understanding of her medical evaluation and treatment plan. Each of their questions answered to their expressed satisfaction.  Return precautions were given.  Patient is well-appearing, stable, and was discharged in good condition.  This chart was dictated using voice recognition software, Dragon. Despite the best efforts of this provider to proofread and correct errors, errors may still occur which can change documentation meaning.   Final Clinical Impression(s) / ED Diagnoses Final diagnoses:  Dental infection    Rx / DC Orders ED Discharge Orders          Ordered    amoxicillin-clavulanate (AUGMENTIN) 875-125 MG tablet  Every 12 hours        02/21/22 0057              Analy Bassford, Gypsy Balsam, PA-C 02/21/22 0115    Orpah Greek, MD 02/21/22 [phone]

## 2022-03-04 DIAGNOSIS — Z419 Encounter for procedure for purposes other than remedying health state, unspecified: Secondary | ICD-10-CM | POA: Diagnosis not present

## 2022-04-04 DIAGNOSIS — Z419 Encounter for procedure for purposes other than remedying health state, unspecified: Secondary | ICD-10-CM | POA: Diagnosis not present

## 2022-04-30 ENCOUNTER — Encounter (HOSPITAL_COMMUNITY): Payer: Self-pay | Admitting: Emergency Medicine

## 2022-04-30 ENCOUNTER — Emergency Department (HOSPITAL_COMMUNITY)
Admission: EM | Admit: 2022-04-30 | Discharge: 2022-05-01 | Disposition: A | Discharge status: Home / Self Care | Payer: Medicaid Other | Attending: Emergency Medicine | Admitting: Emergency Medicine

## 2022-04-30 DIAGNOSIS — N644 Mastodynia: Secondary | ICD-10-CM

## 2022-04-30 NOTE — ED Triage Notes (Addendum)
PT reports pain from lump in her left breast and is menstruating. No nipple discharge. Last OB appt was with last child 7 years ago. Very painful to palpation. Been taking OTC pain meds for it. Noticed it yesterday

## 2022-05-01 NOTE — Discharge Instructions (Signed)
Likely this is fibrocystic breast disease, and during your menstrual cycle, recommend NSAIDs and wearing supportive bras.  Please follow-up with the women's urgent care as well as the breast clinic for further evaluation  Come back to the emergency department if you develop chest pain, shortness of breath, severe abdominal pain, uncontrolled nausea, vomiting, diarrhea.

## 2022-05-01 NOTE — ED Provider Notes (Signed)
[place] EMERGENCY DEPARTMENT AT [facility] Provider Note   CSN: [id number] Arrival date & time: 04/30/22  2314     History  Chief Complaint  Patient presents with   Breast Mass    Meagan Burgess is a 32 y.o. female.  HPI   Patient that send medical history presenting with complaints of left-sided breast pain, states that she noticed it yesterday, states while she was showering she felt a lump on with her left breast, now she is having some pain there, states that her breast is more tender, she denies any nipple discharge, there is no surrounding redness, denies any trauma to area, is never had this in the past.  She states that she does started her menstrual cycle, she is currently not on birth control, she has no personal history of breast cancer, no family history of breast cancer, she denies any weight gain weight loss fevers chills.  Patient denies any chest pain shortness of breath she has no history of PEs or DVTs currently not on oral birth control.  She does not have an OB/GYN at this time.  Home Medications Prior to Admission medications   Medication Sig Start Date End Date Taking? Authorizing Provider  acetaminophen (TYLENOL) 500 MG tablet Take 1,000 mg by mouth daily as needed for mild pain or headache.    [provider]  amoxicillin-clavulanate (AUGMENTIN) 875-125 MG tablet Take 1 tablet by mouth every 12 (twelve) hours. 02/21/22   Sponseller, Lupe Carney R, PA-C  fluticasone (FLONASE) 50 MCG/ACT nasal spray Place 1 spray into both nostrils daily. 10/04/21 10/04/22  Rana Snare, DO  ibuprofen (ADVIL) 200 MG tablet Take 800 mg by mouth daily as needed for headache, mild pain or cramping.    [provider]      Allergies    Patient has no known allergies.    Review of Systems   Review of Systems  Constitutional:  Negative for chills and fever.  Respiratory:  Negative for shortness of breath.   Cardiovascular:  Negative for chest pain.   Gastrointestinal:  Negative for abdominal pain.  Neurological:  Negative for headaches.    Physical Exam Updated Vital Signs BP 107/71 (BP Location: Left Arm)   Pulse (!) 54   Temp 97.9 F (36.6 C) (Oral)   Resp 16   LMP 04/28/2022   SpO2 100%  Physical Exam Vitals and nursing note reviewed. Exam conducted with a chaperone present.  Constitutional:      General: She is not in acute distress.    Appearance: Normal appearance. She is not ill-appearing or diaphoretic.  HENT:     Head: Normocephalic and atraumatic.     Nose: No congestion or rhinorrhea.  Eyes:     Conjunctiva/sclera: Conjunctivae normal.  Pulmonary:     Effort: Pulmonary effort is normal.     Breath sounds: Normal breath sounds.  Musculoskeletal:     Cervical back: Neck supple.     Comments: No unilateral leg swelling no calf tenderness no palpable cords.  Skin:    General: Skin is warm and dry.     Comments: With chaperone present breast exam was performed, no noted nipple inversion no nipple discharge, no noted erythema no macules or papules present, patient had a small mobile smooth nodule directly beneath the nipple, slightly tender to palpation.  She had no palpable lymph nodes along the left upper quadrant the left breast nor along the left auxiliary.  Neurological:     Mental Status:  She is alert and oriented to person, place, and time.  Psychiatric:        Mood and Affect: Mood normal.     ED Results / Procedures / Treatments   Labs (all labs ordered are listed, but only abnormal results are displayed) Labs Reviewed - No data to display  EKG None  Radiology No results found.  Procedures Procedures    Medications Ordered in ED Medications - No data to display  ED Course/ Medical Decision Making/ A&P                             Medical Decision Making  This patient presents to the ED for concern of left breast pain, this involves an extensive number of treatment options, and is a  complaint that carries with it a high risk of complications and morbidity.  The differential diagnosis includes abscess, malignancy, PE    Additional history obtained:  Additional history obtained from N/A External records from outside source obtained and reviewed including recent ER notes   Co morbidities that complicate the patient evaluation  N/A  Social Determinants of Health:  No primary care provider    Lab Tests:  I Ordered, and personally interpreted labs.  The pertinent results include: N/A   Imaging Studies ordered:  I ordered imaging studies including n/a I independently visualized and interpreted imaging which showed n/a I agree with the radiologist interpretation   Cardiac Monitoring:  The patient was maintained on a cardiac monitor.  I personally viewed and interpreted the cardiac monitored which showed an underlying rhythm of: n/a   Medicines ordered and prescription drug management:  I ordered medication including n/a I have reviewed the patients home medicines and have made adjustments as needed  Critical Interventions:  N/a   Reevaluation:  Patient had benign physical exam agreement discharge at this time.  Consultations Obtained:  N/a    Test Considered:  N/a    Rule out Suspicion for malignancy is low as masses none fixated none spiculated, no associated family history of malignancy, no associated night sweats fevers chills weight loss.  I doubt infectious etiology like an abscess as there is no fluctuance induration there is no overlying skin changes.  I doubt PE as she is PERC negative.  I doubt ACS she has low risk factors presentation atypical especially since there is a nodule which is painful during my examination.    Dispostion and problem list  After consideration of the diagnostic results and the patients response to treatment, I feel that the patent would benefit from discharge.  Left breast pain-suspect fibrocystic  breast disease, will recommend NSAIDs, but will refer her to the women's urgent care as well as the breast clinic for further evaluation.            Final Clinical Impression(s) / ED Diagnoses Final diagnoses:  Breast pain    Rx / DC Orders ED Discharge Orders     None         Carroll Sage, PA-C 05/01/22 0334    Gilda Crease, MD 05/01/22 [phone]

## 2022-05-04 DIAGNOSIS — Z419 Encounter for procedure for purposes other than remedying health state, unspecified: Secondary | ICD-10-CM | POA: Diagnosis not present

## 2022-06-04 DIAGNOSIS — Z419 Encounter for procedure for purposes other than remedying health state, unspecified: Secondary | ICD-10-CM | POA: Diagnosis not present

## 2022-07-04 DIAGNOSIS — Z419 Encounter for procedure for purposes other than remedying health state, unspecified: Secondary | ICD-10-CM | POA: Diagnosis not present

## 2022-08-04 DIAGNOSIS — Z419 Encounter for procedure for purposes other than remedying health state, unspecified: Secondary | ICD-10-CM | POA: Diagnosis not present

## 2022-09-04 DIAGNOSIS — Z419 Encounter for procedure for purposes other than remedying health state, unspecified: Secondary | ICD-10-CM | POA: Diagnosis not present

## 2022-09-09 NOTE — Progress Notes (Signed)
This encounter was created in error - please disregard.

## 2022-10-04 DIAGNOSIS — Z419 Encounter for procedure for purposes other than remedying health state, unspecified: Secondary | ICD-10-CM | POA: Diagnosis not present

## 2022-10-31 ENCOUNTER — Emergency Department (HOSPITAL_BASED_OUTPATIENT_CLINIC_OR_DEPARTMENT_OTHER)
Admission: EM | Admit: 2022-10-31 | Discharge: 2022-11-01 | Disposition: A | Discharge status: Home / Self Care | Payer: Medicaid Other | Attending: Emergency Medicine | Admitting: Emergency Medicine

## 2022-10-31 ENCOUNTER — Other Ambulatory Visit: Payer: Self-pay

## 2022-10-31 ENCOUNTER — Encounter (HOSPITAL_BASED_OUTPATIENT_CLINIC_OR_DEPARTMENT_OTHER): Payer: Self-pay

## 2022-10-31 DIAGNOSIS — I10 Essential (primary) hypertension: Secondary | ICD-10-CM | POA: Insufficient documentation

## 2022-10-31 DIAGNOSIS — R519 Headache, unspecified: Secondary | ICD-10-CM | POA: Diagnosis not present

## 2022-10-31 DIAGNOSIS — Z711 Person with feared health complaint in whom no diagnosis is made: Secondary | ICD-10-CM

## 2022-10-31 DIAGNOSIS — Z202 Contact with and (suspected) exposure to infections with a predominantly sexual mode of transmission: Secondary | ICD-10-CM | POA: Diagnosis not present

## 2022-10-31 LAB — COMPREHENSIVE METABOLIC PANEL
ALT: 15 U/L (ref 0–44)
AST: 19 U/L (ref 15–41)
Albumin: 4.4 g/dL (ref 3.5–5.0)
Alkaline Phosphatase: 81 U/L (ref 38–126)
Anion gap: 7 (ref 5–15)
BUN: 9 mg/dL (ref 6–20)
CO2: 26 mmol/L (ref 22–32)
Calcium: 9.7 mg/dL (ref 8.9–10.3)
Chloride: 106 mmol/L (ref 98–111)
Creatinine, Ser: 0.73 mg/dL (ref 0.44–1.00)
GFR, Estimated: >60 mL/min (ref >60)
Glucose, Bld: 98 mg/dL (ref 70–99)
Potassium: 3.6 mmol/L (ref 3.5–5.1)
Sodium: 139 mmol/L (ref 135–145)
Total Bilirubin: 0.3 mg/dL (ref 0.3–1.2)
Total Protein: 6.8 g/dL (ref 6.5–8.1)

## 2022-10-31 LAB — CBC WITH DIFFERENTIAL/PLATELET
Abs Immature Granulocytes: 0.02 10*3/uL (ref 0.00–0.07)
Basophils Absolute: 0.1 10*3/uL (ref 0.0–0.1)
Basophils Relative: 1 %
Eosinophils Absolute: 0.3 10*3/uL (ref 0.0–0.5)
Eosinophils Relative: 4 %
HCT: 38.8 % (ref 36.0–46.0)
Hemoglobin: 13.1 g/dL (ref 12.0–15.0)
Immature Granulocytes: 0 %
Lymphocytes Relative: 33 %
Lymphs Abs: 2.6 10*3/uL (ref 0.7–4.0)
MCH: 31.3 pg (ref 26.0–34.0)
MCHC: 33.8 g/dL (ref 30.0–36.0)
MCV: 92.6 fL (ref 80.0–100.0)
Monocytes Absolute: 0.5 10*3/uL (ref 0.1–1.0)
Monocytes Relative: 6 %
Neutro Abs: 4.6 10*3/uL (ref 1.7–7.7)
Neutrophils Relative %: 56 %
Platelets: 256 10*3/uL (ref 150–400)
RBC: 4.19 MIL/uL (ref 3.87–5.11)
RDW: 11.9 % (ref 11.5–15.5)
WBC: 8.1 10*3/uL (ref 4.0–10.5)
nRBC: 0 % (ref 0.0–0.2)

## 2022-10-31 LAB — URINALYSIS, ROUTINE W REFLEX MICROSCOPIC
Bilirubin Urine: NEGATIVE
Glucose, UA: NEGATIVE mg/dL
Hgb urine dipstick: NEGATIVE
Ketones, ur: NEGATIVE mg/dL
Leukocytes,Ua: NEGATIVE
Nitrite: NEGATIVE
Protein, ur: NEGATIVE mg/dL
Specific Gravity, Urine: 1.01 (ref 1.005–1.030)
pH: 7 (ref 5.0–8.0)

## 2022-10-31 LAB — WET PREP, GENITAL
Clue Cells Wet Prep HPF POC: NONE SEEN
Sperm: NONE SEEN
Trich, Wet Prep: NONE SEEN
WBC, Wet Prep HPF POC: <10 (ref <10)
Yeast Wet Prep HPF POC: NONE SEEN

## 2022-10-31 NOTE — ED Triage Notes (Signed)
Pt complaining of headache for the last 4-5 days. Has been having urinary frequency for the last 2-3 days and pressure on the bladder. Has a rash as well on the vagina.

## 2022-10-31 NOTE — ED Provider Notes (Signed)
[facility] DEPARTMENT AT [facility] Provider Note   CSN: [id number] Arrival date & time: 10/31/22  1949     History  Chief Complaint  Patient presents with   Headache    Meagan Burgess is a 32 y.o. female.  The history is provided by the patient and medical records. No language interpreter was used.  Headache      32 year old female with history of recurrent UTI, hypertension, presenting with multiple complaints.  Patient states she has had recurrent headache ongoing for at least the past 4 to 5 days.  She describes headaches as a pressure sensation across her forehead and her temporal region that happen periodically with some lightheadedness without vision change.  She also endorsed some sensitivity to light and sound.  She tries taking Tylenol and ibuprofen at home with some relief.  She does not endorse any fever, neck stiffness, or cold symptoms.  She does not endorse any focal numbness or focal weakness.  Patient also complaints of urinary frequency.  She states for the past month she has had increased urinary frequency without urgency or burning urination.  No vaginal bleeding or vaginal discharge.  States she is prone for UTI.  She endorsed pressure in her bladder.  Furthermore, patient also complaining of a rash around her vaginal region that has been present for at least a week and a half.  It is mildly discomforting and she thought it may be related to her genital herpes but states she is never been officially diagnosed for that before.  She tries using hydrocortisone cream with some improvement.  Patient states she would like to be screened for STI because her partner broke his condom during one of the sexual encounter.  She mention she has had STI in the past.  She denies any vaginal discharge or pain with sexual activity.   Home Medications Prior to Admission medications   Medication Sig Start Date End Date Taking? Authorizing Provider   acetaminophen (TYLENOL) 500 MG tablet Take 1,000 mg by mouth daily as needed for mild pain or headache.    [provider]  amoxicillin-clavulanate (AUGMENTIN) 875-125 MG tablet Take 1 tablet by mouth every 12 (twelve) hours. 02/21/22   Sponseller, Lupe Carney R, PA-C  fluticasone (FLONASE) 50 MCG/ACT nasal spray Place 1 spray into both nostrils daily. 10/04/21 10/04/22  Rana Snare, DO  ibuprofen (ADVIL) 200 MG tablet Take 800 mg by mouth daily as needed for headache, mild pain or cramping.    [provider]      Allergies    Patient has no known allergies.    Review of Systems   Review of Systems  Neurological:  Positive for headaches.  All other systems reviewed and are negative.   Physical Exam Updated Vital Signs BP (!) 152/97   Pulse 88   Temp 98.3 F (36.8 C)   Resp 16   Ht 5\' 2"  (1.575 m)   Wt 56.7 kg   LMP 10/24/2022   SpO2 99%   BMI 22.86 kg/m  Physical Exam Vitals and nursing note reviewed.  Constitutional:      General: She is not in acute distress.    Appearance: She is well-developed.  HENT:     Head: Normocephalic and atraumatic.     Right Ear: Tympanic membrane normal.     Left Ear: Tympanic membrane normal.     Nose: Nose normal.     Mouth/Throat:     Mouth: Mucous membranes are moist.  Eyes:  Extraocular Movements: Extraocular movements intact.     Conjunctiva/sclera: Conjunctivae normal.     Pupils: Pupils are equal, round, and reactive to light.  Neck:     Vascular: No carotid bruit.  Cardiovascular:     Rate and Rhythm: Normal rate and regular rhythm.     Pulses: Normal pulses.     Heart sounds: Normal heart sounds.  Pulmonary:     Effort: Pulmonary effort is normal.  Abdominal:     Palpations: Abdomen is soft.     Tenderness: There is no abdominal tenderness. There is no right CVA tenderness or left CVA tenderness.  Musculoskeletal:     Cervical back: Normal range of motion and neck supple. No rigidity or tenderness.   Lymphadenopathy:     Cervical: No cervical adenopathy.  Skin:    Findings: No rash.  Neurological:     Mental Status: She is alert.  Psychiatric:        Mood and Affect: Mood normal.     ED Results / Procedures / Treatments   Labs (all labs ordered are listed, but only abnormal results are displayed) Labs Reviewed  URINALYSIS, ROUTINE W REFLEX MICROSCOPIC - Abnormal; Notable for the following components:      Result Value   Color, Urine COLORLESS (*)    All other components within normal limits  WET PREP, GENITAL  CBC WITH DIFFERENTIAL/PLATELET  COMPREHENSIVE METABOLIC PANEL  RAPID HIV SCREEN (HIV 1/2 AB+AG)  RPR  GC/CHLAMYDIA PROBE AMP ([place]) NOT AT [facility]    EKG None  Radiology No results found.  Procedures Pelvic exam  Date/Time: 10/31/2022 11:38 PM  Performed by: Fayrene Helper, PA-C Authorized by: Fayrene Helper, PA-C  Comments: Chaperone present during exam.  No inguinal lymphadenopathy or inguinal hernia noted.  Normal external genitalia.  No concerning rash to suggest Herpes.  No significant discomfort with speculum insertion.  Normal vaginal vault, mild vaginal discharge noted close cervical os with some mild friable tissue at the T-zone.  On bimanual exam no adnexal tenderness or cervical motion tenderness.       Medications Ordered in ED Medications - No data to display  ED Course/ Medical Decision Making/ A&P                                 Medical Decision Making Amount and/or Complexity of Data Reviewed Labs: ordered.  Risk Prescription drug management.   BP (!) 152/97   Pulse 88   Temp 98.3 F (36.8 C)   Resp 16   Ht 5\' 2"  (1.575 m)   Wt 56.7 kg   LMP 10/24/2022   SpO2 99%   BMI 22.86 kg/m   59:81 PM 32 year old female with history of recurrent UTI, hypertension, presenting with multiple complaints.  Patient states she has had recurrent headache ongoing for at least the past 4 to 5 days.  She describes headaches as a pressure  sensation across her forehead and her temporal region that happen periodically with some lightheadedness without vision change.  She also endorsed some sensitivity to light and sound.  She tries taking Tylenol and ibuprofen at home with some relief.  She does not endorse any fever, neck stiffness, or cold symptoms.  She does not endorse any focal numbness or focal weakness.  Patient also complaints of urinary frequency.  She states for the past month she has had increased urinary frequency without urgency or burning urination.  No vaginal  bleeding or vaginal discharge.  States she is prone for UTI.  She endorsed pressure in her bladder.  Furthermore, patient also complaining of a rash around her vaginal region that has been present for at least a week and a half.  It is mildly discomforting and she thought it may be related to her genital herpes but states she is never been officially diagnosed for that before.  She tries using hydrocortisone cream with some improvement.  Patient states she would like to be screened for STI because her partner broke his condom during one of the sexual encounter.  She mention she has had STI in the past.  She denies any vaginal discharge or pain with sexual activity.  Exam overall reassuring patient does not have any nuchal rigidity concerning for meningitis.  She does not have any acute onset thunderclap headache concerning for subarachnoid hemorrhage.  She does not have any focal neurodeficit symptom concerning for acute stroke or space-occupying lesion.  -Labs ordered, independently viewed and interpreted by me.  Labs remarkable for reassuring lab values.  RPR, HIV, Wet prep and gc/ch is pending -The patient was maintained on a cardiac monitor.  I personally viewed and interpreted the cardiac monitored which showed an underlying rhythm of: NSR -Imaging not considered -This patient presents to the ED for concern of multiple complaint, this involves an extensive number of  treatment options, and is a complaint that carries with it a high risk of complications and morbidity.  The differential diagnosis includes migraine, tension headache, clustered headache, meningitis, space occupying lesion, UTI, PID, Cervicitis -Co morbidities that complicate the patient evaluation includes reoccuring UTI -Treatment includes reassurance -Reevaluation of the patient after these medicines showed that the patient improved -PCP office notes or outside notes reviewed -Escalation to admission/observation considered: patients feels much better, is comfortable with discharge, and will follow up with PCP -Prescription medication considered, patient comfortable with OTC meds -Social Determinant of Health considered         Final Clinical Impression(s) / ED Diagnoses Final diagnoses:  Recurrent headache  Concern about STI in female without diagnosis    Rx / DC Orders ED Discharge Orders          Ordered    ibuprofen (ADVIL) 600 MG tablet  Every 6 hours PRN        11/01/22 0011              Fayrene Helper, PA-C 11/01/22 0013    Anders Simmonds T, DO 11/01/22 2355

## 2022-11-01 LAB — RAPID HIV SCREEN (HIV 1/2 AB+AG)
HIV 1/2 Antibodies: NONREACTIVE
HIV-1 P24 Antigen - HIV24: NONREACTIVE

## 2022-11-01 LAB — RPR: RPR Ser Ql: NONREACTIVE

## 2022-11-01 MED ORDER — IBUPROFEN 600 MG PO TABS: 600.0000 mg | ORAL_TABLET | Freq: Four times a day (QID) | ORAL | 0 refills | Status: AC | PRN

## 2022-11-01 NOTE — Discharge Instructions (Signed)
You have been evaluated for your symptoms.  You have been screened for potential sexually transmitted infection.  You may check the result through MyChart, link below.  It usually takes about 2 to 3 days for the results to show up.  If you test positive for infection, you will be notified with appropriate treatment.  For your headache, you should continue to take Tylenol or ibuprofen as needed for headache.  Follow-up with your primary care doctor for further care.  Fortunately today the urine did not show any signs of infection.

## 2022-11-01 NOTE — ED Notes (Signed)
 RN reviewed discharge instructions with pt. Pt verbalized understanding and had no further questions. VSS upon discharge.  

## 2022-11-02 LAB — GC/CHLAMYDIA PROBE AMP (~~LOC~~) NOT AT ARMC
Chlamydia: NEGATIVE
Comment: NEGATIVE
Comment: NORMAL
Neisseria Gonorrhea: NEGATIVE

## 2022-11-04 DIAGNOSIS — Z419 Encounter for procedure for purposes other than remedying health state, unspecified: Secondary | ICD-10-CM | POA: Diagnosis not present

## 2022-12-04 DIAGNOSIS — Z419 Encounter for procedure for purposes other than remedying health state, unspecified: Secondary | ICD-10-CM | POA: Diagnosis not present

## 2023-01-04 DIAGNOSIS — Z419 Encounter for procedure for purposes other than remedying health state, unspecified: Secondary | ICD-10-CM | POA: Diagnosis not present

## 2023-02-04 DIAGNOSIS — Z419 Encounter for procedure for purposes other than remedying health state, unspecified: Secondary | ICD-10-CM | POA: Diagnosis not present

## 2023-03-04 DIAGNOSIS — Z419 Encounter for procedure for purposes other than remedying health state, unspecified: Secondary | ICD-10-CM | POA: Diagnosis not present

## 2023-04-15 DIAGNOSIS — Z419 Encounter for procedure for purposes other than remedying health state, unspecified: Secondary | ICD-10-CM | POA: Diagnosis not present

## 2023-05-03 DIAGNOSIS — K011 Impacted teeth: Secondary | ICD-10-CM | POA: Diagnosis not present

## 2023-05-15 DIAGNOSIS — Z419 Encounter for procedure for purposes other than remedying health state, unspecified: Secondary | ICD-10-CM | POA: Diagnosis not present

## 2023-06-15 DIAGNOSIS — Z419 Encounter for procedure for purposes other than remedying health state, unspecified: Secondary | ICD-10-CM | POA: Diagnosis not present

## 2023-07-15 DIAGNOSIS — Z419 Encounter for procedure for purposes other than remedying health state, unspecified: Secondary | ICD-10-CM | POA: Diagnosis not present

## 2023-07-19 ENCOUNTER — Ambulatory Visit: Admitting: Family

## 2023-07-19 DIAGNOSIS — Z124 Encounter for screening for malignant neoplasm of cervix: Secondary | ICD-10-CM

## 2023-07-19 NOTE — Progress Notes (Deleted)
 Phone [phone]  Subjective:   Patient is a 33 y.o. female presenting for annual physical.    No chief complaint on file.   See problem oriented charting- ROS- full  review of systems was completed and negative except for *** noted in HPI above.  The following were reviewed and entered/updated in epic: Past Medical History:  Diagnosis Date   Abnormal Pap smear    colpo- wnl   Herpes    Urinary tract infection    Patient Active Problem List   Diagnosis Date Noted   Tobacco abuse 12/03/2015   Past Surgical History:  Procedure Laterality Date   NO PAST SURGERIES      Family History  Problem Relation Age of Onset   Hypertension Father    Hypertension Maternal Grandmother    Cancer Maternal Grandfather    Diabetes Paternal Grandmother    Anesthesia problems Neg Hx     Medications- reviewed and updated Current Outpatient Medications  Medication Sig Dispense Refill   acetaminophen  (TYLENOL ) 500 MG tablet Take 1,000 mg by mouth daily as needed for mild pain or headache.     fluticasone  (FLONASE ) 50 MCG/ACT nasal spray Place 1 spray into both nostrils daily. 1 g 0   ibuprofen  (ADVIL ) 600 MG tablet Take 1 tablet (600 mg total) by mouth every 6 (six) hours as needed. 30 tablet 0   Current Facility-Administered Medications  Medication Dose Route Frequency Provider Last Rate Last Admin   medroxyPROGESTERone  (DEPO-PROVERA ) injection 150 mg  150 mg Intramuscular Once Rudy Carlin LABOR, MD        Allergies-reviewed and updated No Known Allergies  Social History   Social History Narrative   Not on file    Objective:  There were no vitals taken for this visit. Physical Exam Vitals and nursing note reviewed.  Constitutional:      Appearance: Normal appearance.  HENT:     Head: Normocephalic.     Right Ear: Tympanic membrane normal.     Left Ear: Tympanic membrane normal.     Nose: Nose normal.     Mouth/Throat:     Mouth: Mucous membranes are moist.  Eyes:      Pupils: Pupils are equal, round, and reactive to light.  Cardiovascular:     Rate and Rhythm: Normal rate and regular rhythm.  Pulmonary:     Effort: Pulmonary effort is normal.     Breath sounds: Normal breath sounds.  Musculoskeletal:        General: Normal range of motion.     Cervical back: Normal range of motion.  Lymphadenopathy:     Cervical: No cervical adenopathy.  Skin:    General: Skin is warm and dry.  Neurological:     Mental Status: She is alert.  Psychiatric:        Mood and Affect: Mood normal.        Behavior: Behavior normal.       Assessment and Plan   Health Maintenance counseling: 1. Anticipatory guidance: Patient counseled regarding regular dental exams q6 months, eye exams,  avoiding smoking and second hand smoke, limiting alcohol to 1 beverage per day, no illicit drugs.   2. Risk factor reduction:  Advised patient of need for regular exercise and diet rich with fruits and vegetables to reduce risk of heart attack and stroke. Wt Readings from Last 3 Encounters:  10/31/22 125 lb (56.7 kg)  02/20/22 120 lb (54.4 kg)  11/07/17 115 lb (52.2 kg)   3. Immunizations/screenings/ancillary studies  Immunization History  Administered Date(s) Administered   Influenza Split 11/16/2011   Pneumococcal Polysaccharide-23 11/17/2011   Health Maintenance Due  Topic Date Due   Hepatitis C Screening  Never done   DTaP/Tdap/Td (1 - Tdap) Never done   Hepatitis B Vaccines (1 of 3 - 19+ 3-dose series) Never done   Pneumococcal Vaccine 60-95 Years old (2 of 2 - PCV) 11/16/2012   HPV VACCINES (1 - 3-dose SCDM series) Never done   Cervical Cancer Screening (HPV/Pap Cotest)  Never done   COVID-19 Vaccine (1 - 2024-25 season) Never done    4. Cervical cancer screening: *** 5. Skin cancer screening- advised regular sunscreen use. Denies worrisome, changing, or new skin lesions.  6. Birth control/STD check: *** 7. Smoking associated screening: *** smoker 8. Alcohol  screening: ***   Recommended follow up: ***No follow-ups on file. Future Appointments  Date Time Provider Department Center  07/19/2023  8:30 AM Lucius Krabbe, NP [facility]  08/04/2023 10:30 AM Caudle, Thersia Bitters, FNP [facility]    Lab/Order associations:fasting    Lucius Krabbe, NP

## 2023-08-04 ENCOUNTER — Ambulatory Visit (HOSPITAL_BASED_OUTPATIENT_CLINIC_OR_DEPARTMENT_OTHER): Admitting: Family Medicine

## 2023-08-15 DIAGNOSIS — Z419 Encounter for procedure for purposes other than remedying health state, unspecified: Secondary | ICD-10-CM | POA: Diagnosis not present

## 2023-09-15 DIAGNOSIS — Z419 Encounter for procedure for purposes other than remedying health state, unspecified: Secondary | ICD-10-CM | POA: Diagnosis not present

## 2023-10-09 DIAGNOSIS — H5213 Myopia, bilateral: Secondary | ICD-10-CM | POA: Diagnosis not present

## 2023-11-15 DIAGNOSIS — Z419 Encounter for procedure for purposes other than remedying health state, unspecified: Secondary | ICD-10-CM | POA: Diagnosis not present
